# Patient Record
Sex: Female | Born: 1960 | Race: Black or African American | Hispanic: No | Marital: Single | Smoking: Never smoker
Health system: Southern US, Community
[De-identification: ages and names within clinical notes are randomized; demographics above are authoritative.]

---

## 2000-02-08 ENCOUNTER — Other Ambulatory Visit: Admission: RE | Admit: 2000-02-08 | Discharge: 2000-02-08 | Payer: Self-pay | Admitting: Obstetrics and Gynecology

## 2003-02-24 ENCOUNTER — Other Ambulatory Visit: Admission: RE | Admit: 2003-02-24 | Discharge: 2003-02-24 | Payer: Self-pay | Admitting: Obstetrics and Gynecology

## 2003-03-05 ENCOUNTER — Ambulatory Visit (HOSPITAL_COMMUNITY): Admission: RE | Admit: 2003-03-05 | Discharge: 2003-03-05 | Payer: Self-pay | Admitting: Obstetrics and Gynecology

## 2003-03-05 ENCOUNTER — Encounter: Payer: Self-pay | Admitting: Obstetrics and Gynecology

## 2005-01-26 ENCOUNTER — Other Ambulatory Visit: Admission: RE | Admit: 2005-01-26 | Discharge: 2005-01-26 | Payer: Self-pay | Admitting: Obstetrics and Gynecology

## 2005-02-02 ENCOUNTER — Ambulatory Visit (HOSPITAL_COMMUNITY): Admission: RE | Admit: 2005-02-02 | Discharge: 2005-02-02 | Payer: Self-pay | Admitting: Obstetrics and Gynecology

## 2005-10-05 ENCOUNTER — Encounter: Admission: RE | Admit: 2005-10-05 | Discharge: 2005-10-05 | Payer: Self-pay | Admitting: Obstetrics and Gynecology

## 2006-12-10 HISTORY — PX: UTERINE FIBROID SURGERY: SHX826

## 2007-01-03 ENCOUNTER — Encounter: Admission: RE | Admit: 2007-01-03 | Discharge: 2007-01-03 | Payer: Self-pay | Admitting: Obstetrics and Gynecology

## 2007-03-28 ENCOUNTER — Inpatient Hospital Stay (HOSPITAL_COMMUNITY): Admission: RE | Admit: 2007-03-28 | Discharge: 2007-03-30 | Payer: Self-pay | Admitting: Obstetrics and Gynecology

## 2007-03-28 ENCOUNTER — Encounter (INDEPENDENT_AMBULATORY_CARE_PROVIDER_SITE_OTHER): Payer: Self-pay | Admitting: Specialist

## 2008-06-24 ENCOUNTER — Ambulatory Visit: Payer: Self-pay | Admitting: Cardiovascular Disease

## 2008-06-24 ENCOUNTER — Observation Stay (HOSPITAL_COMMUNITY): Admission: EM | Admit: 2008-06-24 | Discharge: 2008-06-25 | Payer: Self-pay | Admitting: Family Medicine

## 2008-06-25 ENCOUNTER — Encounter (INDEPENDENT_AMBULATORY_CARE_PROVIDER_SITE_OTHER): Payer: Self-pay | Admitting: Internal Medicine

## 2009-09-01 ENCOUNTER — Emergency Department (HOSPITAL_COMMUNITY): Admission: EM | Admit: 2009-09-01 | Discharge: 2009-09-01 | Payer: Self-pay | Admitting: Family Medicine

## 2011-04-24 NOTE — H&P (Signed)
[name], [name]                ACCOUNT NO.:  [id number]   MEDICAL RECORD NO.:  [id number]          PATIENT TYPE:  INP   LOCATION:  1828                         FACILITY:  [facility]   PHYSICIAN:  Dominique Gardener, MD    DATE OF BIRTH:  1961/08/21   DATE OF ADMISSION:  06/24/2008  DATE OF DISCHARGE:                              HISTORY & PHYSICAL   PRIMARY PHYSICIAN:  Unassigned.   HISTORY OF PRESENT ILLNESS:  This is a 50 year old female with no  significant past medical history who presented with chest pain.  The  patient woke up from sleep at 1 in the morning with chest pain in the  middle of her chest described as pressure like around 5/10 with no  radiation.  There is no nausea, no vomiting, no shortness of breath.  She took 2 aspirin that improved her pain a little, but her pain is  still present.  She got back to back to sleep, and when she woke up  around 7 in the morning, she was still having some pain.  Again, she  took 2 aspirin, but she was still having some pain, so she decided to  come to the ER.  In the ER, her first troponin was negative.  EKG was  done and came to be normal.   PAST MEDICAL HISTORY:  None.   PAST SURGICAL HISTORY:  None.   MEDICATIONS:  None.   ALLERGIES:  No known drug allergies.   SOCIAL HISTORY:  She denies smoking, denies alcohol drink, denies  recreational drugs.   FAMILY HISTORY:  Her father had heart attack either in his 30s or 45s.  Her uncle died of massive heart attack.  He has history of CABG.   REVIEW OF SYSTEMS:  CONSTITUTIONAL:  There is no fatigability.  There is  no fever.  EYES:  No blurred vision.  ENT:  No tinnitus.  RESPIRATORY:  No cough, no wheezes.  CARDIOVASCULAR:  Positive for chest pain.  There  is no shortness of breath.  There is no syncope.  GASTROINTESTINAL:  No  nausea or vomiting or diarrhea.  GENITOURINARY:  No dysuria or  hematuria.  ENDOCRINE:  No polyuria or nocturia.  HEMATOLOGIC:  No  bruises, no bleeding.   ID:  No rash, no lesions.  NEUROLOGICAL:  No  numbness or tingling.  The rest of systems reviewed, and they were  negative.   PHYSICAL EXAMINATION:  VITAL SIGNS:  Temperature is 99.4, blood pressure  141/98, pulse 105, respiratory rate 16.  GENERAL APPEARANCE:  This is a middle-aged [place] American female in no  acute distress.  HEENT: Conjunctivae are pink.  Pupils are equal and reactive to light.  There is no ptosis.  Hearing is intact.  There is no ear discharge or  infection.  There is no nose infection or bleeding.  Oral mucosa dry.  No pharyngeal erythema.  NECK:  Supple.  No JVD, no carotid bruit, no lymphadenopathy, no thyroid  enlargement, no thyroid tenderness.  CARDIOVASCULAR EXAMINATION:  S1 and S2 regular.  There are no murmurs,  no rubs, and  no thrills.  RESPIRATORY EXAMINATION:  The patient is breathing between 16 to 18.  There are no rales, no rhonchi and no wheezes.  ABDOMEN:  Soft, not distended, no tenderness, no hepatosplenomegaly,  bowel sounds are normal.  LOWER EXTREMITIES:  No edema, no rash and no varicose veins.  SKIN:  No rash and erythema.  NEUROLOGICAL:  Cranial nerves are intact x4.  There are no motor or  sensory deficits.   LABORATORY RESULTS:  Sodium 137, potassium 4.4, chloride 105, bicarb 27,  creatinine 1, BUN 9, glucose 106.  WBC 8.7, hemoglobin 12.8, hematocrit  37.7, MCV 91.9, platelet count 244.  Urinalysis is negative.  Chest x-  ray showed elevation of the left hemidiaphragm.   IMPRESSION:  1. Chest pain.  2. Obesity.  3. Positive family history of coronary artery disease.   PLAN:  This patient's risk factors include obesity and positive family  history.  I will admit her to telemetry.  I will get 3 sets of troponin  and cardiac enzymes.  I will get serial EKG.  I will get echocardiogram  to assess her ejection fraction and to see if there is any wall motion  abnormalities.  If any of the above-mentioned tests including troponin,   EKG or echocardiogram show abnormalities, then will consider cardiology  evaluation for risk stratification of her factors and possible stress  test.   Assessment time is 1 hour.      Dominique Gardener, MD  Electronically Signed     NAE/MEDQ  D:  06/24/2008  T:  06/24/2008  Job:  [id number]

## 2011-04-27 NOTE — Discharge Summary (Signed)
NAMEERYANNA, [name]                ACCOUNT NO.:  [id number]   MEDICAL RECORD NO.:  [id number]          PATIENT TYPE:  OBV   LOCATION:  3734                         FACILITY:  [facility]   PHYSICIAN:  Michelene Gardener, MD    DATE OF BIRTH:  Nov 26, 1961   DATE OF ADMISSION:  06/24/2008  DATE OF DISCHARGE:  06/25/2008                               DISCHARGE SUMMARY   DISCHARGE DIAGNOSES:  1. Atypical chest pain.  2. Obesity.   DISCHARGE MEDICATIONS:  None.   CONSULTATIONS:  None.   PROCEDURES:  None.   RADIOLOGY STUDIES:  1. Chest x-ray on June 24, 2008, showed no acute abnormalities.      Another x-ray was done on June 24, 2008, showed elevated left      hemidiaphragm with mild atelectasis.  2. Echocardiogram on June 25, 2008, showed ejection fraction of 60%      and there was no left ventricular abnormalities.   COURSE OF HOSPITALIZATION:  This is a 50 year old female with no  significant past medical history presented to the hospital with chest  pain.  She has a strong family history of coronary artery disease.  The  patient was admitted for further evaluation and to be ruled out.  Three  sets of troponin and cardiac enzymes were done and they came to be  normal.  Serial EKGs were done and showed no evidence of acute ischemia.  Echocardiogram was done and showed normal ejection fraction with normal  wall motion.  While in the hospital, she was put on aspirin, beta-  blocker, nitroglycerin, and Lovenox.  At the time of discharge, the  patient was totally back to normal.  There is no chest pain.  There is  no shortness of breath.  Her chest pain was felt to be secondary to  stress versus GI symptoms.  She was advised to follow with her primary  doctor if her symptoms recur.  Total assessment time is 1 hour 40  minutes.      Michelene Gardener, MD  Electronically Signed     NAE/MEDQ  D:  07/12/2008  T:  07/13/2008  Job:  [id number]

## 2011-04-27 NOTE — H&P (Signed)
[name]                ACCOUNT NO.:  [id number]   MEDICAL RECORD NO.:  [id number]        PATIENT TYPE:  LINP   LOCATION:                               FACILITY:  [facility]   PHYSICIAN:  Crist Fat. Rivard, M.D. DATE OF BIRTH:  05-25-1961   DATE OF ADMISSION:  03/28/2007  DATE OF DISCHARGE:                              HISTORY & PHYSICAL   HISTORY OF PRESENT ILLNESS:  [name] is a 50 year old single African-  American female, gravida 0, who presents for a robot-assisted myomectomy  because of large uterine fibroids and dysmenorrhea.  This patient has a  longstanding history of uterine fibroids which over the past years has  caused her increasing dysmenorrhea.  She characterizes her dysmenorrhea  as cramps starting one week prior to her menstrual period and lasting  until her period ends.  Her pain is rated at a 7/10 on a 10-point pain  scale; however, she does find relief with ibuprofen 600 mg.  The  patient's menstrual flow will last for 7 days and require the use of 4  tampons daily.  She admits to occasional pelvic pressure but denies any  back pain, urinary tract symptoms, nausea, vomiting, fever or vaginitis  symptoms.  A pelvic ultrasound in January 2008 revealed a uterus  measuring 9.5 x 6.0 x 7.0 cm and 6 measurable uterine fibroids.  The  locations of these fibroids are as follow:  She has two anterior and  lower uterine segment pedunculated fibroids, the first of which measures  4.2 x 3.3 x 3.9 cm, the other 2.9 x 2.3 x 2.2 cm.  Another fibroid is  anteriorly, measuring 2.9 x 2.5 x 2.6 cm.  Posteriorly there is a 2.9 x  2.5 x 2.7 cm fibroid.  Anteriorly on the left there is a 4.4 x 4.3 x 3.8  cm fibroid, and the largest, which also is pedunculated, is anterior on  the left measuring 10.1 x 7.2 x 9.3 cm.  Due to the size of these  fibroids, the patient's ovaries were not able to be identified.  Given  the increasing size of her fibroids and the discomfort she  experiences  before and during her menstrual period, the patient desires to proceed  with myomectomy in order to preserve her fertility.   PAST MEDICAL HISTORY:  OB history:  Is a gravida 0.   GYN history:  Menarche 50 years old.  Last menstrual period March 13, 2007.  The patient uses abstinence as her method of contraception.  She  denies any history of sexually transmitted diseases or abnormal Pap  smears.  Her last normal Pap smear was January 2008, last normal  mammogram January 2008.   Medical history:  Negative.   Surgical history:  Negative.   FAMILY HISTORY:  Prostate cancer, schizophrenia, diabetes mellitus,  hypertension, and cardiovascular disease.   SOCIAL HISTORY:  The patient is single and she works with [employer].   HABITS:  She does not use alcohol or tobacco.   CURRENT MEDICATIONS:  Ibuprofen 600 mg as needed.   She has no known  drug allergies.   REVIEW OF SYSTEMS:  She denied any chest pain, shortness of breath,  headache, vision changes, and except as mentioned in history of present  illness, the patient's review of systems is negative.   PHYSICAL EXAMINATION:  VITAL SIGNS:  Blood pressure is 110/80, weight is  211 pounds, height 5 feet 3-1/2 inches tall.  NECK:  Supple.  There are no masses, thyromegaly or cervical adenopathy.  HEART:  Regular rate and rhythm.  LUNGS:  Clear.  BACK:  No CVA tenderness.  ABDOMEN:  There is no tenderness or organomegaly.  However, the patient  does have a firm mass arising from the pelvis to the level of her  umbilicus.  This mass is more prominent left of midline.  EXTREMITIES:  No clubbing, cyanosis, or edema.  PELVIC:  EG/BUS is within normal limits.  Vagina is rugous.  Cervix is  nontender without lesions.  Uterus is enlarged to approximately 18-20  weeks' size, irregular; however, there is no tenderness.  Adnexa are  without tenderness, and there are no separable masses.  Rectovaginal  exam:   No masses.   IMPRESSION:  1. Large uterine fibroids.  2. Dysmenorrhea.   DISPOSITION:  A discussion was held with the patient regarding the  indication for her procedure along with its risks, which include but are  not limited to reaction to anesthesia, damage to adjacent organs  (bladder, ureters, etc.), infection, excessive bleeding, the possibility  that her surgery may have to be completed through an open incision in  her abdomen, and hysterectomy.  The patient was further made aware of  postoperative facial swelling, the length of her procedure, the  expectation to be discharged on postop day #1 with follow-up in 2 weeks.  The patient was given a Miralax bowel prep to be completed the day prior  to surgery.  The patient verbalized understanding of these risks as well  as expectations and has consented to proceed with a robot-assisted  myomectomy at [facility] on March 28, 2007, at 7:30  a.m.      [name].      Crist Fat Rivard, M.D.  Electronically Signed    EJP/MEDQ  D:  03/22/2007  T:  03/22/2007  Job:  [id number]

## 2011-04-27 NOTE — Op Note (Signed)
NAMEHIBO, BLASDELL                ACCOUNT NO.:  [id number]   MEDICAL RECORD NO.:  [id number]          PATIENT TYPE:  AMB   LOCATION:  DAY                          FACILITY:  [facility]   PHYSICIAN:  Crist Fat. Rivard, M.D. DATE OF BIRTH:  Oct 06, 1961   DATE OF PROCEDURE:  03/28/2007  DATE OF DISCHARGE:                               OPERATIVE REPORT   PREOPERATIVE DIAGNOSIS:  Large uterine fibroids with dysmenorrhea.   POSTOPERATIVE DIAGNOSIS:  Large uterine fibroids with dysmenorrhea.   ANESTHESIA:  General, Dr. Rica Mast.   PROCEDURE:  Diagnostic laparoscopy and myomectomy via laparotomy.   SURGEON:  Crist Fat. Rivard, M.D.   ASSISTANTMarquis Lunch. Powell, P.A.-C.   ESTIMATED BLOOD LOSS:  300 mL.   DESCRIPTION OF PROCEDURE:  After being informed of the planned procedure  with possible complications including bleeding, infection, injury to  bowel, bladder or ureters, need for laparotomy and risks for  hysterectomy, informed consent is obtained.  The patient is taken to OR  10 and given general anesthesia with endotracheal intubation without  complication.  She is placed in the lithotomy position on the sticky  mattress, arms are padded and tucked on her side, legs have knee high  sequential compression devices, shoulder restraints are applied and are  padded extra, and her chest is secured to the table.  She is then  prepped and draped in a sterile fashion.  A Foley catheter is inserted  in her bladder.  GYN exam reveals an enlarged uterus, 20-22 weeks, with  a dominant fundal fibroid which was previously described on ultrasound  as being pedunculated.  During this pelvic exam.  It is impossible to  clarify if the fibroid is pedunculated or not.  A weighted speculum was  inserted and the anterior lip of the cervix was grasped with a tenaculum  forceps.  The uterus was easily sounded at 16.  A Zumi intrauterine  manipulator is inserted and the tenaculum forceps is changed for a  Jacobs  forceps.   The measurement above the umbilicus is at 12 cm for our camera port and  this area is infiltrated with Marcaine 0.255 mL.  We perform a midline  12 mm incision which was brought down sharply to the fascia.  The fascia  is grasped with Kocher forceps, incised, and a pursestring suture is  applied around the fascia.  The peritoneum is entered bluntly.  This  allows easy entry of the 12 mm Hassan trocar which was held in place  with the previously placed pursestring suture.  We can now insufflate a  pneumoperitoneum with CO2 at a maximum pressure of 15 mmHg.  The camera  is inserted.   Observation:  We visualize a greatly enlarged uterus, irregular, and  mainly we see the top of the uterine fibroid.  With this Zumi  manipulator, it is impossible to assess if that is pedunculated or not.  The decision is made to insert a second trocar to be able to mobilize  the fibroid and determine if this procedure will be accessible via  robotic laparoscopy.  A mid body  right 8 mm robotic trocar is inserted  under direct visualization after infiltrating with Marcaine 0.25 and  this allows [place] some manipulation of the uterus.  We are able to  visualize 360 degrees around the fibroid which was not pedunculated and  encompasses the bulk of the uterus.  We are unable to see the anterior  lower uterine segment of the uterus.  Both tubes are normal.  Both  ovaries are normal.  The posterior cul-de-sac is free of disease.  The  fibroid mass appears to be composed of 4-5 different intramural  fibroids.  The decision is made to not to proceed via laparoscopy.  The  scope was removed, the trocars were removed.  The fascia of the camera  port is closed with the pursestring suture previously placed and the  skin of both incisions is closed with subcuticular suture of 3-0  Monocryl.   The patient's legs are brought down and we perform a midline incision  from symphysis pubis to umbilicus.  This was  brought down sharply to the  fascia.  The fascia is incised in a midline fashion and the linea alba  is identified.  The peritoneum is entered in a midline fashion.  We are  able to deliver the bulk of the uterus through the incision and restart  our myomectomy.  A posterior fundal and posterior incision is made after  infiltrating with vasopressin 100 units in 100 mL.  This allows [place]  removal of multiple large fibroids.  The removal of the fibroids was  achieved bluntly and with electrocauterization.  Once we confirmed no  other fibroid is accessible through this incision, it is then deemed  necessary to proceed with closure of the myometrium.  The myometrium is  closed with multiple layers of figure-of-eight stitches of 0 Vicryl.  The serosa over this incision is closed with a baseball suture of 2-0  Vicryl.   We proceed with another right posterior lower uterine incision to remove  six other fibroids and this incision is performed after infiltration  with vasopressin same dilution.  Again, the fibroids were removed  sharply and bluntly, but mostly using electrocauterization.  The  myometrium of this incision, which is about 4 cm, is closed with  multiple layers of the figure-of-eight stitches of 0 Vicryl and with a  baseball suture of 2-0 Vicryl.  Hemostasis of both incisions is  adequate.  We then look on the anterior aspect of the uterus where we  note five fibroids in the left lower uterine segment that are  intramyometrial and we infiltrate that serosa with vasopressin, same  dilution, perform a 4 cm incision, and remove five fibroids from that  area, as well.  Again, the myometrium was closed with figure-of-eight  stitches of 0 Vicryl and with the 2-0 baseball suture.  Hemostasis is  adequate.   Finally, we have to direct our attention towards five anterior broad  ligament fibroids that are right at the lower uterine junction with the bladder.  The serosa is infiltrated with  vasopressin and the anterior  broad ligament is opened.  These fibroids were easily enucleated using  blunt dissection and electrocauterization.  One of the fibroids is  directly adjacent to the uterine artery and uterine vessels.  A figure-  of-eight stitch of 0 Vicryl is necessary to achieve hemostasis.  Hemostasis being adequate, the broad ligament is then closed with a  running suture of 3-0 Vicryl.   25 fibroids have been removed in total.  The uterus has almost returned  to a normal shape.  Both tubes and ovaries are left unharmed.  The  uterine cavity was not entered but it was a matter of a few mm before it  was entered.  We then irrigate profusely with warm saline and note a  satisfactory hemostasis. The uterus was returned in the abdominal pelvic  cavity and all incisions were covered with Intercede.  The fascia  hemostasis is completed with cautery and the fascia is closed with two  running sutures of #1 Vicryl.  The incision is then irrigated with warm  saline.  Hemostasis was completed with cautery and with a right 1 cm  counter incision, we leave a #10 JP drain in the incision.  This drain  is sutured to the skin with 3-0 nylon.  The skin of the incision is then  closed with subcuticular suture of 3-0 Monocryl and 2 inch Steri-Strips.   Instruments and sponge count is complete x2.  Estimated blood loss was  300 mL. The procedure is very well tolerated by the patient who is taken  to the recovery room in a well and stable condition.  Again, 25 fibroid  tumors were removed weighing a total of 650 grams.  If this patient did  pursue pregnancy, I would recommend an elective cesarean section.     Crist Fat Rivard, M.D.  Electronically Signed    SAR/MEDQ  D:  03/28/2007  T:  03/28/2007  Job:  [id number]

## 2011-09-07 LAB — CARDIAC PANEL(CRET KIN+CKTOT+MB+TROPI)
CK, MB: 0.3
CK, MB: 0.3
CK, MB: 0.4
Relative Index: INVALID
Relative Index: INVALID
Relative Index: INVALID
Total CK: 56
Total CK: 59
Total CK: 60
Troponin I: 0.02
Troponin I: <0.01
Troponin I: <0.01

## 2011-09-07 LAB — LIPID PANEL
Cholesterol: 201 — ABNORMAL HIGH
HDL: 45
LDL Cholesterol: 129 — ABNORMAL HIGH
Total CHOL/HDL Ratio: 4.5
Triglycerides: 136
VLDL: 27

## 2011-09-07 LAB — DIFFERENTIAL
Basophils Absolute: 0
Basophils Relative: 0
Eosinophils Absolute: 0
Eosinophils Relative: 0
Lymphocytes Relative: 16
Lymphs Abs: 1.4
Monocytes Absolute: 0.7
Monocytes Relative: 8
Neutro Abs: 6.5
Neutrophils Relative %: 75

## 2011-09-07 LAB — POCT I-STAT, CHEM 8
BUN: 9
Calcium, Ion: 1.23
Chloride: 105
Creatinine, Ser: 1
Glucose, Bld: 106 — ABNORMAL HIGH
HCT: 39
Hemoglobin: 13.3
Potassium: 4.4
Sodium: 137
TCO2: 27

## 2011-09-07 LAB — COMPREHENSIVE METABOLIC PANEL
ALT: 13
AST: 18
Albumin: 3.3 — ABNORMAL LOW
Alkaline Phosphatase: 42
BUN: 7
CO2: 24
Calcium: 8.9
Chloride: 106
Creatinine, Ser: 0.79
GFR calc Af Amer: >60
GFR calc non Af Amer: >60
Glucose, Bld: 101 — ABNORMAL HIGH
Potassium: 3.8
Sodium: 137
Total Bilirubin: 1.1
Total Protein: 6.4

## 2011-09-07 LAB — URINALYSIS, ROUTINE W REFLEX MICROSCOPIC
Bilirubin Urine: NEGATIVE
Glucose, UA: NEGATIVE
Hgb urine dipstick: NEGATIVE
Ketones, ur: NEGATIVE
Nitrite: NEGATIVE
Protein, ur: NEGATIVE
Specific Gravity, Urine: 1.014
Urobilinogen, UA: 0.2
pH: 7

## 2011-09-07 LAB — MAGNESIUM: Magnesium: 2

## 2011-09-07 LAB — PREGNANCY, URINE: Preg Test, Ur: NEGATIVE

## 2011-09-07 LAB — CBC
HCT: 37.7
Hemoglobin: 12.8
MCHC: 33.9
MCV: 91.9
Platelets: 244
RBC: 4.1
RDW: 12.6
WBC: 8.7

## 2011-09-07 LAB — POCT CARDIAC MARKERS
CKMB, poc: <1 — ABNORMAL LOW
Myoglobin, poc: 31.2
Operator id: 294501
Troponin i, poc: <0.05

## 2011-09-07 LAB — D-DIMER, QUANTITATIVE: D-Dimer, Quant: 0.33

## 2011-09-07 LAB — PHOSPHORUS: Phosphorus: 4.2

## 2011-09-07 LAB — TSH: TSH: 1.91

## 2012-08-04 ENCOUNTER — Ambulatory Visit: Payer: Self-pay | Admitting: Family Medicine

## 2012-08-04 ENCOUNTER — Ambulatory Visit: Payer: Self-pay

## 2012-08-04 VITALS — BP 137/85 | HR 84 | Temp 98.5°F | Resp 20 | Ht 64.0 in | Wt 234.0 lb

## 2012-08-04 DIAGNOSIS — M25531 Pain in right wrist: Secondary | ICD-10-CM

## 2012-08-04 DIAGNOSIS — M778 Other enthesopathies, not elsewhere classified: Secondary | ICD-10-CM

## 2012-08-04 DIAGNOSIS — M25539 Pain in unspecified wrist: Secondary | ICD-10-CM

## 2012-08-04 MED ORDER — PREDNISONE 20 MG PO TABS: ORAL_TABLET | ORAL | Status: AC

## 2012-08-04 NOTE — Progress Notes (Signed)
This 51 year old woman comes in with right wrist pain 48 hours. 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 She says the pain is more prominent on the ulnar side of the distal forearm and that it hurts to flex and extend her wrist. She recalls no specific injury that would've led to this work. She denies any falls on outstretched hand. She has no chronic ongoing arthritis issues.  Objective: Right wrist appears normal She has no Tinel's or Finkelstein's test positive Range of motion is restricted because of pain-patient does not flex or extend the wrist but does have minimal lateral movement either way. Skin: No signs of trauma [facility] reading (PRIMARY) by  Dr. Milus Glazier.  Right wrist film: negative for bony abn   Assessment:  Right wrist tendinitis  Plan: Wrist splint for week, prednisone 20 mg 2 daily for 5 days with food, and work note for today and tomorrow

## 2013-10-02 ENCOUNTER — Ambulatory Visit (INDEPENDENT_AMBULATORY_CARE_PROVIDER_SITE_OTHER): Payer: BC Managed Care – PPO | Admitting: Internal Medicine

## 2013-10-02 VITALS — BP 128/72 | HR 95 | Temp 98.6°F | Resp 16 | Ht 64.0 in | Wt 244.4 lb

## 2013-10-02 DIAGNOSIS — Z23 Encounter for immunization: Secondary | ICD-10-CM

## 2013-10-02 DIAGNOSIS — R05 Cough: Secondary | ICD-10-CM

## 2013-10-02 DIAGNOSIS — R059 Cough, unspecified: Secondary | ICD-10-CM

## 2013-10-02 DIAGNOSIS — J069 Acute upper respiratory infection, unspecified: Secondary | ICD-10-CM

## 2013-10-02 MED ORDER — BENZONATATE 100 MG PO CAPS: 100.0000 mg | ORAL_CAPSULE | Freq: Three times a day (TID) | ORAL | Status: DC | PRN

## 2013-10-02 NOTE — Patient Instructions (Signed)
Take Mucinex to help loosen congestion. Drink plenty of fluids. Return if fever greater than 101, shortness of breath, no improvement in 5-7 days. Make appointment for complete physical exam at appointment center.

## 2013-10-02 NOTE — Progress Notes (Signed)
  Subjective:    Patient ID: [name], female    DOB: 1961/09/16, 52 y.o.   MRN: [id number]  HPI Soret throat, cough and fever for 8 days. Took advil cold and sinus with considerable relief. Didn't take temp, felt warm, had chills. Is feeling much better this afternoon. No headache. No ear pain. Nasal congestion, clear, thin. Cough intermittently productive, no color. Cough wakes her up at night. No SOB, some pain with coughing, better now.  Slightly decreased appetite, no nausea or vomiting.  Works at [employer] and is exposed to paper dust. She has been able to work this week.   Review of Systems  Constitutional: Positive for fever and chills.  HENT: Positive for congestion and rhinorrhea. Negative for ear pain, postnasal drip and sinus pressure.   Respiratory: Positive for cough. Negative for shortness of breath.   Cardiovascular: Positive for chest pain.       CP with cough only, better now.       Objective:   Physical Exam  Nursing note and vitals reviewed. Constitutional: She is oriented to person, place, and time. She appears well-developed and well-nourished. No distress.  HENT:  Head: Normocephalic.  Right Ear: Tympanic membrane, external ear and ear canal normal.  Left Ear: Tympanic membrane, external ear and ear canal normal.  Nose: Rhinorrhea present.  Mouth/Throat: Uvula is midline and mucous membranes are normal. Oropharyngeal exudate and posterior oropharyngeal erythema present.  Eyes: Pupils are equal, round, and reactive to light. Right eye exhibits no discharge. Left eye exhibits no discharge.  Neck: Normal range of motion. Neck supple.  Cardiovascular: Normal rate, regular rhythm and normal heart sounds.   Pulmonary/Chest: Effort normal and breath sounds normal.  Lymphadenopathy:    She has no cervical adenopathy.  Neurological: She is alert and oriented to person, place, and time.  Skin: Skin is warm and dry. She is not diaphoretic.   Psychiatric: She has a normal mood and affect. Her behavior is normal. Judgment and thought content normal.       Assessment & Plan:  Cough  Acute upper respiratory infections of unspecified site  Need for prophylactic vaccination and inoculation against influenza - Plan: Flu Vaccine QUAD 36+ mos IM  1- cough/viral URI- discussed s/s of flu and bacterial vs. Viral URI. Will treat symptomatically with tessalon pearles, mucinex otc, increase fluids. RTC if no improvement in 5-7 days, worsening symptoms, fever greater than 101. 2- flu shot today 3- Patient desires to make appointment for CPE at appointment center and is aware of need for pap/mammo.  I have completed the patient encounter in its entirety as documented by FNP Leone Payor, with editing by me where necessary. Madalina Rosman P. Merla Riches, M.D.

## 2013-12-01 ENCOUNTER — Ambulatory Visit: Payer: BC Managed Care – PPO | Admitting: Family Medicine

## 2014-03-30 ENCOUNTER — Ambulatory Visit (INDEPENDENT_AMBULATORY_CARE_PROVIDER_SITE_OTHER): Payer: BC Managed Care – PPO

## 2014-03-30 ENCOUNTER — Encounter: Payer: Self-pay | Admitting: Podiatry

## 2014-03-30 ENCOUNTER — Ambulatory Visit (INDEPENDENT_AMBULATORY_CARE_PROVIDER_SITE_OTHER): Payer: BC Managed Care – PPO | Admitting: Podiatry

## 2014-03-30 VITALS — BP 128/88 | HR 61 | Resp 16 | Ht 64.0 in | Wt 240.0 lb

## 2014-03-30 DIAGNOSIS — M722 Plantar fascial fibromatosis: Secondary | ICD-10-CM

## 2014-03-30 MED ORDER — MELOXICAM 15 MG PO TABS: 15.0000 mg | ORAL_TABLET | Freq: Every day | ORAL | Status: DC

## 2014-03-30 MED ORDER — METHYLPREDNISOLONE (PAK) 4 MG PO TABS: ORAL_TABLET | ORAL | Status: DC

## 2014-03-30 NOTE — Progress Notes (Signed)
   Subjective:    Patient ID: [name], female    DOB: 1961/04/14, 53 y.o.   MRN: [id number]  HPI Comments: N heel pain L right heel lateral and medial inferior arch D 7 years O on and off for years, worsening 2 weeks C sharp pain A worse in the morning and after rest T [facility] orthotics  Foot Pain      Review of Systems  Musculoskeletal: Positive for back pain.  Skin: Positive for color change.       Nails - B/L 1st toenails.  All other systems reviewed and are negative.      Objective:   Physical Exam I have reviewed her past medical history medications allergies surgeries social history and review of systems. Vital signs are stable she is alert and oriented x3. Pulses are strong and stable. Capillary fill time to digits one through 5 bilateral foot is immediate. Neurologic sensorium is intact per since once the monofilament and deep tendon reflexes are brisk and equal bilateral. Muscle strength is 5 over 5 dorsiflexors plantar flexors inverters everters all intrinsic musculature is intact. Orthopedic evaluation demonstrates all joints distal to the ankle a full range of motion without crepitation. She has no pain on medial and lateral compression of the calcaneus however she does have pain on direct palpation of the medial calcaneal tubercle of the right heel. Left heel to lesser degree. Radiographic evaluation demonstrates soft tissue increase in density at the plantar fascial calcaneal insertion sites bilateral.        Assessment & Plan:  Assessment: Plantar fasciitis bilateral.  Plan: We discussed the etiology pathology conservative versus surgical therapies. I injected her bilateral heels today with Kenalog and local anesthetic. A total of 20 mg was injected into each heel. Plantar fascial brace is were dispensed bilaterally. A night splint was dispensed for the right foot. She was scanned today for orthotics. We also sent her home with a prescription for Medrol Dosepak to  be followed by medic in we discussed these medications in detail. I will followup with her in one month however between now and then she was given both oral and written home-going instructions for the care of this foot. She was also discussed shoe gear modification icing stretching exercises.

## 2014-04-29 ENCOUNTER — Ambulatory Visit (INDEPENDENT_AMBULATORY_CARE_PROVIDER_SITE_OTHER): Payer: BC Managed Care – PPO | Admitting: Podiatry

## 2014-04-29 ENCOUNTER — Encounter: Payer: Self-pay | Admitting: Podiatry

## 2014-04-29 VITALS — BP 122/80 | HR 62 | Resp 16

## 2014-04-29 DIAGNOSIS — M722 Plantar fascial fibromatosis: Secondary | ICD-10-CM

## 2014-04-29 NOTE — Progress Notes (Signed)
She presents today for followup of her plantar fasciitis of her right foot states it is approximately 80% better and continues all conservative therapies.  Objective: Pulses are palpable right foot. No calf pain. She has pain on palpation medial calcaneal tubercle of the right heel.  Assessment: Plantar fasciitis right foot.  Plan: Injected the right heel today with Kenalog and local anesthetic and she will followup with me on an as-needed basis. She will continue all conservative therapies.

## 2014-06-03 ENCOUNTER — Ambulatory Visit: Payer: BC Managed Care – PPO | Admitting: Podiatry

## 2014-06-10 ENCOUNTER — Other Ambulatory Visit: Payer: Self-pay | Admitting: Podiatry

## 2014-06-10 NOTE — Telephone Encounter (Signed)
Pt needs an appt prior to future refills. 

## 2015-01-03 ENCOUNTER — Encounter (HOSPITAL_COMMUNITY): Payer: Self-pay | Admitting: Emergency Medicine

## 2015-01-03 ENCOUNTER — Emergency Department (HOSPITAL_COMMUNITY)
Admission: EM | Admit: 2015-01-03 | Discharge: 2015-01-03 | Disposition: A | Discharge status: Home / Self Care | Payer: BLUE CROSS/BLUE SHIELD | Source: Home / Self Care | Attending: Emergency Medicine | Admitting: Emergency Medicine

## 2015-01-03 DIAGNOSIS — J209 Acute bronchitis, unspecified: Secondary | ICD-10-CM

## 2015-01-03 MED ORDER — PREDNISONE 50 MG PO TABS: ORAL_TABLET | ORAL | Status: DC

## 2015-01-03 MED ORDER — AZITHROMYCIN 250 MG PO TABS: ORAL_TABLET | ORAL | Status: DC

## 2015-01-03 MED ORDER — HYDROCODONE-HOMATROPINE 5-1.5 MG/5ML PO SYRP: 5.0000 mL | ORAL_SOLUTION | Freq: Four times a day (QID) | ORAL | Status: DC | PRN

## 2015-01-03 NOTE — ED Notes (Signed)
Reports "cold and flu" symptoms that started Wednesday.

## 2015-01-03 NOTE — ED Notes (Signed)
Stuffy nose, sore throat and cough, cough causes pain in torso

## 2015-01-03 NOTE — ED Provider Notes (Signed)
CSN: [id number]     Arrival date & time 01/03/15  1036 History   First MD Initiated Contact with Patient 01/03/15 1248     Chief Complaint  Patient presents with  . URI   (Consider location/radiation/quality/duration/timing/severity/associated sxs/prior Treatment) HPI  Patient is a 54 year old woman here for evaluation of cough. She states for 5 days she has had nasal congestion, sore throat, cough. Over the last few days she has had pain in her chest and rib cage when she coughs. Her symptoms have gradually been worsening. She reports subjective fevers. No nausea, vomiting, diarrhea. No shortness of breath.  History reviewed. No pertinent past medical history. Past Surgical History  Procedure Laterality Date  . Uterine fibroid surgery  2008   Family History  Problem Relation Age of Onset  . Arthritis Mother   . Hypertension Mother   . Hypertension Father   . Heart disease Father   . Hypertension Sister   . Thyroid disease Sister    History  Substance Use Topics  . Smoking status: Never Smoker   . Smokeless tobacco: Not on file  . Alcohol Use: No   OB History    No data available     Review of Systems  Constitutional: Positive for fever. Negative for appetite change.  HENT: Positive for congestion and sore throat. Negative for rhinorrhea, sinus pressure and trouble swallowing.   Respiratory: Positive for cough. Negative for shortness of breath.   Gastrointestinal: Negative for nausea, vomiting, abdominal pain and diarrhea.  Musculoskeletal: Negative for myalgias.  Neurological: Negative for headaches.    Allergies  Review of patient's allergies indicates no known allergies.  Home Medications   Prior to Admission medications   Medication Sig Start Date End Date Taking? Authorizing Provider  azithromycin (ZITHROMAX Z-PAK) 250 MG tablet Take 2 pills today, then 1 pill daily until gone. 01/03/15   Charm Rings, MD  BIOTIN PO Take by mouth.    Historical Provider, MD   HYDROcodone-homatropine (HYCODAN) 5-1.5 MG/5ML syrup Take 5 mLs by mouth every 6 (six) hours as needed for cough. 01/03/15   Charm Rings, MD  ibuprofen (ADVIL,MOTRIN) 200 MG tablet Take 400 mg by mouth every 6 (six) hours as needed.    Historical Provider, MD  meloxicam (MOBIC) 15 MG tablet TAKE 1 TABLET BY MOUTH EVERY DAY 06/10/14   [name], DPM  methylPREDNIsolone (MEDROL DOSPACK) 4 MG tablet follow package directions 03/30/14   [name], DPM  predniSONE (DELTASONE) 50 MG tablet Take 1 pill daily for 5 days 01/03/15   Charm Rings, MD  Prenatal Vit-Fe Fumarate-FA (PRENATAL MULTIVITAMIN) TABS tablet Take 1 tablet by mouth daily at 12 noon.    Historical Provider, MD   BP 164/98 mmHg  Pulse 96  Temp(Src) 98.9 F (37.2 C) (Oral)  Resp 16  SpO2 96%  LMP 12/14/2014 Physical Exam  Constitutional: She is oriented to person, place, and time. She appears well-developed and well-nourished. No distress.  HENT:  Nose: Nose normal.  Mouth/Throat: Oropharynx is clear and moist. No oropharyngeal exudate.  Neck: Neck supple.  Cardiovascular: Normal rate, regular rhythm and normal heart sounds.   No murmur heard. Pulmonary/Chest: Breath sounds normal. No respiratory distress. She has no wheezes. She has no rales.  Diffuse rhonchi, clears with cough  Lymphadenopathy:    She has no cervical adenopathy.  Neurological: She is alert and oriented to person, place, and time.    ED Course  Procedures (including critical care time) Labs  Review Labs Reviewed - No data to display  Imaging Review No results found.   MDM   1. Acute bronchitis, unspecified organism    We'll treat with a Z-Pak and 5 days of prednisone. Hycodan to use as needed for cough. Follow-up as needed.    Charm RingsErin J Honig, MD 01/03/15 [phone]

## 2015-01-03 NOTE — Discharge Instructions (Signed)
You have bronchitis. Take prednisone 1 pill daily for 5 days. Take azithromycin as prescribed. Use Hycodan every 4 hours as needed for coughing. This medicine has a narcotic in it so do not take while driving. You should start to feel better in the next 2-3 days. Follow-up as needed.   Acute Bronchitis Bronchitis is when the airways that extend from the windpipe into the lungs get red, puffy, and painful (inflamed). Bronchitis often causes thick spit (mucus) to develop. This leads to a cough. A cough is the most common symptom of bronchitis. In acute bronchitis, the condition usually begins suddenly and goes away over time (usually in 2 weeks). Smoking, allergies, and asthma can make bronchitis worse. Repeated episodes of bronchitis may cause more lung problems. HOME CARE  Rest.  Drink enough fluids to keep your pee (urine) clear or pale yellow (unless you need to limit fluids as told by your doctor).  Only take over-the-counter or prescription medicines as told by your doctor.  Avoid smoking and secondhand smoke. These can make bronchitis worse. If you are a smoker, think about using nicotine gum or skin patches. Quitting smoking will help your lungs heal faster.  Reduce the chance of getting bronchitis again by:  Washing your hands often.  Avoiding people with cold symptoms.  Trying not to touch your hands to your mouth, nose, or eyes.  Follow up with your doctor as told. GET HELP IF: Your symptoms do not improve after 1 week of treatment. Symptoms include:  Cough.  Fever.  Coughing up thick spit.  Body aches.  Chest congestion.  Chills.  Shortness of breath.  Sore throat. GET HELP RIGHT AWAY IF:   You have an increased fever.  You have chills.  You have severe shortness of breath.  You have bloody thick spit (sputum).  You throw up (vomit) often.  You lose too much body fluid (dehydration).  You have a severe headache.  You faint. MAKE SURE YOU:    Understand these instructions.  Will watch your condition.  Will get help right away if you are not doing well or get worse. Document Released: 05/14/2008 Document Revised: 07/29/2013 Document Reviewed: 05/19/2013 [facility] Patient Information 2015 [place], [place]. This information is not intended to replace advice given to you by your health care provider. Make sure you discuss any questions you have with your health care provider.

## 2015-02-07 ENCOUNTER — Ambulatory Visit (INDEPENDENT_AMBULATORY_CARE_PROVIDER_SITE_OTHER): Payer: BLUE CROSS/BLUE SHIELD | Admitting: Family Medicine

## 2015-02-07 VITALS — BP 110/76 | HR 63 | Temp 98.1°F | Resp 20 | Ht 63.5 in | Wt 239.1 lb

## 2015-02-07 DIAGNOSIS — J206 Acute bronchitis due to rhinovirus: Secondary | ICD-10-CM

## 2015-02-07 MED ORDER — PREDNISONE 50 MG PO TABS
ORAL_TABLET | ORAL | Status: DC
Start: 2015-02-07 — End: 2015-11-02

## 2015-02-07 MED ORDER — HYDROCODONE-HOMATROPINE 5-1.5 MG/5ML PO SYRP: 5.0000 mL | ORAL_SOLUTION | Freq: Four times a day (QID) | ORAL | Status: DC | PRN

## 2015-02-07 MED ORDER — AMOXICILLIN 875 MG PO TABS: 875.0000 mg | ORAL_TABLET | Freq: Two times a day (BID) | ORAL | Status: DC

## 2015-02-07 NOTE — Progress Notes (Signed)
This chart was scribed for Dominique SidleKurt Dreamer Carillo, MD by [name], ED Scribe. This patient was seen in room 2 and the patient's care was started at 6:42 PM.  Patient ID: Dominique PontoJudy L Leach MRN: [id number], DOB: [birth year], 54 y.o. Date of Encounter: 02/07/2015, 6:42 PM  Primary Physician: No PCP Per Patient  Chief Complaint:  Chief Complaint  Patient presents with  . Cough    [facility]     HPI: 54 y.o. year old female who works at a [employer] with PMhx history below presents to the office complaining of a recurring cough that started a 1 week ago. Pt states that she was [facility], she says that she thought that she was getting better but her symptoms are coming back.  Pt was prescribed cough syrup, prednisone, and z-pac. She denies and fever, neck pain, sore throat, visual disturbance, CP, cough, SOB, abdominal pain, nausea, emesis, diarrhea, urinary symptoms, back pain, HA, weakness, numbness and rash as associated symptoms.     History reviewed. No pertinent past medical history.   Home Meds: Prior to Admission medications   Medication Sig Start Date End Date Taking? Authorizing Provider  ibuprofen (ADVIL,MOTRIN) 200 MG tablet Take 400 mg by mouth every 6 (six) hours as needed.   Yes Historical Provider, MD  Prenatal Vit-Fe Fumarate-FA (PRENATAL MULTIVITAMIN) TABS tablet Take 1 tablet by mouth daily at 12 noon.   Yes Historical Provider, MD  BIOTIN PO Take by mouth.    Historical Provider, MD  meloxicam (MOBIC) 15 MG tablet TAKE 1 TABLET BY MOUTH EVERY DAY Patient not taking: Reported on 02/07/2015 06/10/14   [name], DPM    Allergies: No Known Allergies  History   Social History  . Marital Status: Single    Spouse Name: N/A  . Number of Children: N/A  . Years of Education: N/A   Occupational History  . Not on file.   Social History Main Topics  . Smoking status: Never Smoker   .  Smokeless tobacco: Never Used  . Alcohol Use: No  . Drug Use: No  . Sexual Activity: Not on file   Other Topics Concern  . Not on file   Social History Narrative     Review of Systems: positive cough Constitutional: negative for chills, fever, night sweats, weight changes, or fatigue  HEENT: negative for vision changes, hearing loss, congestion, rhinorrhea, ST, epistaxis, or sinus pressure Cardiovascular: negative for chest pain or palpitations Respiratory: negative for hemoptysis, wheezing, shortness of breath, or cough Abdominal: negative for abdominal pain, nausea, vomiting, diarrhea, or constipation Dermatological: negative for rash Neurologic: negative for headache, dizziness, or syncope All other systems reviewed and are otherwise negative with the exception to those above and in the HPI.   Physical Exam: Blood pressure 110/76, pulse 63, temperature 98.1 F (36.7 C), temperature source Oral, resp. rate 20, height 5' 3.5" (1.613 m), weight 239 lb 2 oz (108.466 kg), last menstrual period 02/03/2015, SpO2 96 %., Body mass index is 41.69 kg/(m^2). General: Well developed, well nourished, in no acute distress. Head: Normocephalic, atraumatic, eyes without discharge, sclera non-icteric, nares are without discharge. Bilateral auditory canals clear, TM's are without perforation, pearly grey and translucent with reflective cone of light bilaterally. Oral cavity moist, posterior pharynx without exudate, erythema, peritonsillar abscess, or post nasal drip.  Neck: Supple. No thyromegaly. Full ROM. No lymphadenopathy. Lungs: Clear bilaterally to auscultation without wheezes, rales,  or rhonchi. Breathing is unlabored. Heart: RRR with S1 S2. No murmurs, rubs, or gallops appreciated. Abdomen: Soft, non-tender, non-distended with normoactive bowel sounds. No hepatomegaly. No rebound/guarding. No obvious abdominal masses. Msk:  Strength and tone normal for age. Extremities/Skin: Warm and dry. No  clubbing or cyanosis. No edema. No rashes or suspicious lesions. Neuro: Alert and oriented X 3. Moves all extremities spontaneously. Gait is normal. CNII-XII grossly in tact. Psych:  Responds to questions appropriately with a normal affect.     ASSESSMENT AND PLAN:  54 y.o. year old female with  This chart was scribed in my presence and reviewed by me personally.    ICD-9-CM ICD-10-CM   1. Acute bronchitis due to Rhinovirus 466.0 J20.6 amoxicillin (AMOXIL) 875 MG tablet   079.3  HYDROcodone-homatropine (HYCODAN) 5-1.5 MG/5ML syrup     predniSONE (DELTASONE) 50 MG tablet     Signed, Dominique Sidle, MD   Signed, Dominique Sidle, MD 02/07/2015 6:42 PM

## 2015-02-07 NOTE — Patient Instructions (Signed)

## 2015-11-02 ENCOUNTER — Ambulatory Visit (INDEPENDENT_AMBULATORY_CARE_PROVIDER_SITE_OTHER): Payer: BLUE CROSS/BLUE SHIELD | Admitting: Family Medicine

## 2015-11-02 VITALS — BP 132/80 | HR 97 | Temp 98.2°F | Resp 16 | Ht 63.5 in | Wt 249.6 lb

## 2015-11-02 DIAGNOSIS — J209 Acute bronchitis, unspecified: Secondary | ICD-10-CM | POA: Diagnosis not present

## 2015-11-02 DIAGNOSIS — J029 Acute pharyngitis, unspecified: Secondary | ICD-10-CM

## 2015-11-02 DIAGNOSIS — J069 Acute upper respiratory infection, unspecified: Secondary | ICD-10-CM | POA: Diagnosis not present

## 2015-11-02 DIAGNOSIS — R030 Elevated blood-pressure reading, without diagnosis of hypertension: Secondary | ICD-10-CM | POA: Diagnosis not present

## 2015-11-02 MED ORDER — AMOXICILLIN 875 MG PO TABS: 875.0000 mg | ORAL_TABLET | Freq: Two times a day (BID) | ORAL | Status: DC

## 2015-11-02 NOTE — Patient Instructions (Addendum)
Drink plenty of fluids and get enough rest  Take the amoxicillin one twice daily for infection  Use over-the-counter DM-containing cough syrup if needed for cough  Only use the cold and sinus medication when very congested.  Return if not improving  She is to recheck her blood pressure on outpatient basis.

## 2015-11-02 NOTE — Progress Notes (Addendum)
Patient ID: Dominique PontoJudy L Leach, female    DOB: September 09, 1961  Age: 54 y.o. MRN: [id number]  Chief Complaint  Patient presents with  . Sore Throat    x friday  . Nasal Congestion    x friday  . Cough    x friday  . Flu Vaccine    wants the flu shot if given the ok to get it today    Subjective:   54 year old lady who comes in with the history of fibrocystic 6 days of cough and congestion and sore throat. The throat is been more. She hurts a little in the right anterior chest when she coughs. She has not been feverish. She's been taking Tylenol on a regular basis. She is taken at a cold and sinus product, which potentially could raise blood pressure. She does not smoke. She works a job, but is off today. She has worked less couple of days. She is not a regular medications. She is generally a healthy lady.  Current allergies, medications, problem list, past/family and social histories reviewed.  Objective:  BP 132/80 mmHg  Pulse 97  Temp(Src) 98.2 F (36.8 C) (Oral)  Resp 16  Ht 5' 3.5" (1.613 m)  Wt 249 lb 9.6 oz (113.218 kg)  BMI 43.52 kg/m2  SpO2 98%  LMP 10/17/2015  No major distress. TMs are normal. Throat has moderate erythema with the left tonsil having a white area of infection on it. Her neck has small anterior cervical nodes. Chest is clear to status. Heart regular without murmurs.  Assessment & Plan:   Assessment: 1. Acute bronchitis, unspecified organism   2. Acute upper respiratory infection   3. Acute pharyngitis, unspecified etiology   4. Elevated blood pressure (not hypertension)       Plan: With the way the throat looks will go ahead and her on antibiotics. Continue drinking plenty of fluids and taking OTC cough and cold preparations as needed. However she is to monitor blood pressures in the future make sure that they are okay.  No orders of the defined types were placed in this encounter.    Meds ordered this encounter  Medications  . amoxicillin (AMOXIL)  875 MG tablet    Sig: Take 1 tablet (875 mg total) by mouth 2 (two) times daily.    Dispense:  20 tablet    Refill:  0         Patient Instructions  Drink plenty of fluids and get enough rest  Take the amoxicillin one twice daily for infection  Use over-the-counter DM-containing cough syrup if needed for cough  Only use the cold and sinus medication when very congested.  Return if not improving  She is to recheck her blood pressure on outpatient basis.     Return if symptoms worsen or fail to improve.   Alannie Amodio, MD 11/02/2015

## 2015-11-13 ENCOUNTER — Telehealth: Payer: Self-pay

## 2015-11-13 NOTE — Telephone Encounter (Signed)
Requesting more antibiotics - seen just b/4 Thanksgiving   [phone]

## 2015-11-14 NOTE — Telephone Encounter (Signed)
Patient Instructions  Drink plenty of fluids and get enough rest  Take the amoxicillin one twice daily for infection  Use over-the-counter DM-containing cough syrup if needed for cough  Only use the cold and sinus medication when very congested.  Return if not improving  She is to recheck her blood pressure on outpatient basis.     Return if symptoms worsen or fail to improve.        Left message to RTC.

## 2016-09-13 ENCOUNTER — Ambulatory Visit (HOSPITAL_COMMUNITY)
Admission: EM | Admit: 2016-09-13 | Discharge: 2016-09-13 | Disposition: A | Discharge status: Home / Self Care | Payer: BLUE CROSS/BLUE SHIELD | Attending: Internal Medicine | Admitting: Internal Medicine

## 2016-09-13 ENCOUNTER — Encounter (HOSPITAL_COMMUNITY): Payer: Self-pay | Admitting: Emergency Medicine

## 2016-09-13 DIAGNOSIS — L539 Erythematous condition, unspecified: Secondary | ICD-10-CM

## 2016-09-13 DIAGNOSIS — W57XXXA Bitten or stung by nonvenomous insect and other nonvenomous arthropods, initial encounter: Secondary | ICD-10-CM

## 2016-09-13 MED ORDER — TRIAMCINOLONE ACETONIDE 0.1 % EX CREA: 1.0000 "application " | TOPICAL_CREAM | Freq: Two times a day (BID) | CUTANEOUS | 0 refills | Status: DC

## 2016-09-13 NOTE — Discharge Instructions (Signed)
Apply the triamcinolone cream to affected areas twice a day. May apply the Benadryl cream to affected areas 4 times a day. For any areas with swelling redness and intense itching may also apply cold compresses or eyes packs. For worsening, new symptoms or problems such has generalized swelling, swelling of the tongue or throat, trouble breathing cough sig medical attention promptly.

## 2016-09-13 NOTE — ED Provider Notes (Signed)
CSN: [id number]     Arrival date & time 09/13/16  1113 History   First MD Initiated Contact with Patient 09/13/16 1247     Chief Complaint  Patient presents with  . Insect Bite   (Consider location/radiation/quality/duration/timing/severity/associated sxs/prior Treatment) 55 year old female presents to the urgent care after receiving several ant bites from early yesterday morning. She saw several small black ants crawling on her in the bed. Her complaints are that of local lesions that are primarily red and itchy. She has bites to her extremities, behind the right year and neck and one to the right upper eyelid. She has taken Benadryl which does help with the itching.      History reviewed. No pertinent past medical history. Past Surgical History:  Procedure Laterality Date  . UTERINE FIBROID SURGERY  2008   Family History  Problem Relation Age of Onset  . Arthritis Mother   . Hypertension Mother   . Hypertension Father   . Heart disease Father   . Hypertension Sister   . Thyroid disease Sister    Social History  Substance Use Topics  . Smoking status: Never Smoker  . Smokeless tobacco: Never Used  . Alcohol use No   OB History    No data available     Review of Systems  Constitutional: Negative.   HENT: Negative.   Eyes: Negative for photophobia, pain, discharge, redness and visual disturbance.  Respiratory: Negative.   Gastrointestinal: Negative.   Musculoskeletal: Negative.   Skin: Negative for rash.  Neurological: Negative.   All other systems reviewed and are negative.   Allergies  Review of patient's allergies indicates no known allergies.  Home Medications   Prior to Admission medications   Medication Sig Start Date End Date Taking? Authorizing Provider  BIOTIN PO Take by mouth.   Yes Historical Provider, MD  diphenhydrAMINE (BENADRYL) 25 MG tablet Take 50 mg by mouth every 6 (six) hours as needed.   Yes Historical Provider, MD  Prenatal Vit-Fe  Fumarate-FA (PRENATAL MULTIVITAMIN) TABS tablet Take 1 tablet by mouth daily at 12 noon.   Yes Historical Provider, MD  ibuprofen (ADVIL,MOTRIN) 200 MG tablet Take 400 mg by mouth every 6 (six) hours as needed.    Historical Provider, MD  triamcinolone cream (KENALOG) 0.1 % Apply 1 application topically 2 (two) times daily. 09/13/16   Hayden Rasmussenavid Finnbar Cedillos, NP   Meds Ordered and Administered this Visit  Medications - No data to display  BP 156/96 (BP Location: Left Arm)   Pulse 78   Temp 98.6 F (37 C) (Oral)   LMP 08/24/2016 (Exact Date)   SpO2 99%  No data found.   Physical Exam  Constitutional: She is oriented to person, place, and time. She appears well-developed and well-nourished. No distress.  HENT:  Head: Normocephalic and atraumatic.  Mouth/Throat: Oropharynx is clear and moist.  Eyes: Conjunctivae and EOM are normal. Pupils are equal, round, and reactive to light. No scleral icterus.  Right eyelid, upper and lower with mild puffiness. Sclera clear. No evidence of conjunctivitis. Normal pupillary response. No hyphema. Anterior chambers clear. No evidence of infection.  Neck: Normal range of motion. Neck supple.  Cardiovascular: Normal rate and regular rhythm.   Pulmonary/Chest: Effort normal and breath sounds normal. No respiratory distress. She has no wheezes.  Musculoskeletal: She exhibits no edema.  Neurological: She is alert and oriented to person, place, and time. She exhibits normal muscle tone.  Skin: Skin is warm and dry.  Several small papules with  a few small pinpoint pustules to the extremities. Few areas with surrounding erythema. No peripheral edema. No intraoral edema or swelling.  Psychiatric: She has a normal mood and affect.  Nursing note and vitals reviewed.   Urgent Care Course   Clinical Course    Procedures (including critical care time)  Labs Review Labs Reviewed - No data to display  Imaging Review No results found.   Visual Acuity Review  Right  Eye Distance:   Left Eye Distance:   Bilateral Distance:    Right Eye Near:   Left Eye Near:    Bilateral Near:         MDM   1. Insect bite, initial encounter    Apply the triamcinolone cream to affected areas twice a day. May apply the Benadryl cream to affected areas 4 times a day. For any areas with swelling redness and intense itching may also apply cold compresses or eyes packs. For worsening, new symptoms or problems such has generalized swelling, swelling of the tongue or throat, trouble breathing cough sig medical attention promptly. Meds ordered this encounter  Medications  . diphenhydrAMINE (BENADRYL) 25 MG tablet    Sig: Take 50 mg by mouth every 6 (six) hours as needed.  . triamcinolone cream (KENALOG) 0.1 %    Sig: Apply 1 application topically 2 (two) times daily.    Dispense:  15 g    Refill:  0    Order Specific Question:   Supervising Provider    Answer:   Eustace Moore [[id number]]       Hayden Rasmussen, NP 09/13/16 2101

## 2016-09-13 NOTE — ED Triage Notes (Signed)
Pt woke up late Tuesday night/Wednesday morning and discovered black ants in her bed.  She reports bites on her arms, legs, back and one on her right eye.  There are bites on her forearms with white heads and she has some redness and swelling to her right eye lid.  Pt denies any pain.  She took 50 mg of Benadryl last night.

## 2016-10-31 ENCOUNTER — Ambulatory Visit (INDEPENDENT_AMBULATORY_CARE_PROVIDER_SITE_OTHER): Payer: BLUE CROSS/BLUE SHIELD | Admitting: Physician Assistant

## 2016-10-31 VITALS — BP 130/80 | HR 110 | Temp 98.6°F | Resp 17 | Ht 65.0 in | Wt 247.0 lb

## 2016-10-31 DIAGNOSIS — J310 Chronic rhinitis: Secondary | ICD-10-CM

## 2016-10-31 DIAGNOSIS — J0101 Acute recurrent maxillary sinusitis: Secondary | ICD-10-CM | POA: Diagnosis not present

## 2016-10-31 MED ORDER — BENZONATATE 100 MG PO CAPS: 100.0000 mg | ORAL_CAPSULE | Freq: Three times a day (TID) | ORAL | 0 refills | Status: DC | PRN

## 2016-10-31 MED ORDER — FLUTICASONE PROPIONATE 50 MCG/ACT NA SUSP: 2.0000 | Freq: Every day | NASAL | 12 refills | Status: DC

## 2016-10-31 MED ORDER — AZELASTINE HCL 0.15 % NA SOLN: 2.0000 | Freq: Two times a day (BID) | NASAL | 0 refills | Status: AC

## 2016-10-31 MED ORDER — AMOXICILLIN-POT CLAVULANATE 875-125 MG PO TABS: 1.0000 | ORAL_TABLET | Freq: Two times a day (BID) | ORAL | 0 refills | Status: AC

## 2016-10-31 NOTE — Patient Instructions (Addendum)
Start using all four of the medications I've sent to the pharmacy. The tessalon perles (benzonatate) can be stopped when you are not coughing. Stop the antibiotic when the pills are gone. Once your symptoms were well controlled, stop the azelastine nasal spray. Continue the Flonase nasal spray. If your symptoms begin to recur, restart the azelastine nasal spray and/or an OTC antihistamine (like Claritin, Allegra or Zyrtec).    IF you received an x-ray today, you will receive an invoice from [facility]. Please contact [facility] at [phone] with questions or concerns regarding your invoice.   IF you received labwork today, you will receive an invoice from [facility]. Please contact [facility] at [phone] with questions or concerns regarding your invoice.   Our billing staff will not be able to assist you with questions regarding bills from these companies.  You will be contacted with the lab results as soon as they are available. The fastest way to get your results is to activate your My Chart account. Instructions are located on the last page of this paperwork. If you have not heard from us regarding the results in 2 weeks, please contact this office.

## 2016-10-31 NOTE — Progress Notes (Signed)
Patient ID: [name], female    DOB: Sep 07, 1961, 55 y.o.   MRN: [id number]  PCP: No PCP Per Patient  Chief Complaint  Patient presents with  . URI  . Sore Throat    Subjective:   Presents for evaluation of upper respiratory symptoms X >7 days.  Feels like a cold, but every time she feels like she's getting better, she gets sick again.  Chest congestion, sore throat, fatigue, nasal/sinus symptoms. Fever/chills. Cough sometimes productive of sputum. No nausea/vomiting or diarrhea. A little bit of dizziness, mild HA (but gets a HA when she doesn't eat, and her appetite has been less due to the sore throat. No atypical muscle or joint pain.  Intermittently since last year, at least 4 times. Seems like she gets completely well in between episodes.  Similar to bronchitis she had last fall.  Has not had this season's flu vaccine because she hasn't felt well enough.   Review of Systems As above.    There are no active problems to display for this patient.    Prior to Admission medications   Medication Sig Start Date End Date Taking? Authorizing Provider  BIOTIN PO Take by mouth.   Yes Historical Provider, MD  ibuprofen (ADVIL,MOTRIN) 200 MG tablet Take 400 mg by mouth every 6 (six) hours as needed.   Yes Historical Provider, MD  Multiple Vitamins-Minerals (MULTIVITAMIN WITH MINERALS) tablet Take 1 tablet by mouth daily.   Yes Historical Provider, MD  vitamin B-12 (CYANOCOBALAMIN) 100 MCG tablet Take 100 mcg by mouth daily.   Yes Historical Provider, MD     No Known Allergies     Objective:  Physical Exam  Constitutional: She is oriented to person, place, and time. She appears well-developed and well-nourished. No distress.  BP 130/80 (BP Location: Right Arm, Patient Position: Sitting, Cuff Size: Large)   Pulse (!) 110   Temp 98.6 F (37 C) (Oral)   Resp 17   Ht 5\' 5"  (1.651 m)   Wt 247 lb (112 kg)   LMP 10/31/2016 (Approximate)   SpO2 98%   BMI 41.10  kg/m    HENT:  Head: Normocephalic and atraumatic.  Right Ear: Hearing, tympanic membrane, external ear and ear canal normal.  Left Ear: Hearing, tympanic membrane, external ear and ear canal normal.  Nose: Mucosal edema present. No rhinorrhea.  No foreign bodies. Right sinus exhibits maxillary sinus tenderness. Right sinus exhibits no frontal sinus tenderness. Left sinus exhibits maxillary sinus tenderness. Left sinus exhibits no frontal sinus tenderness.  Mouth/Throat: Uvula is midline and mucous membranes are normal. No uvula swelling. Posterior oropharyngeal erythema (mild) present. No oropharyngeal exudate, posterior oropharyngeal edema or tonsillar abscesses.  Eyes: Conjunctivae and EOM are normal. Pupils are equal, round, and reactive to light. Right eye exhibits no discharge. Left eye exhibits no discharge. No scleral icterus.  Neck: Trachea normal, normal range of motion, full passive range of motion without pain and phonation normal. Neck supple. No thyroid mass and no thyromegaly present.  Cardiovascular: Normal rate, regular rhythm and normal heart sounds.   Pulmonary/Chest: Effort normal and breath sounds normal.  Lymphadenopathy:       Head (right side): No submandibular, no tonsillar, no preauricular, no posterior auricular and no occipital adenopathy present.       Head (left side): No submandibular, no tonsillar, no preauricular and no occipital adenopathy present.    She has no cervical adenopathy.       Right: No supraclavicular adenopathy present.  Left: No supraclavicular adenopathy present.  Neurological: She is alert and oriented to person, place, and time. She has normal strength. No cranial nerve deficit or sensory deficit.  Skin: Skin is warm, dry and intact. No rash noted.  Psychiatric: She has a normal mood and affect. Her speech is normal and behavior is normal.           Assessment & Plan:   1. Acute recurrent maxillary sinusitis I suspect that she  has chronic rhinitis, with episodic flares that sometimes become secondarily infected.  - benzonatate (TESSALON) 100 MG capsule; Take 1-2 capsules (100-200 mg total) by mouth 3 (three) times daily as needed for cough.  Dispense: 40 capsule; Refill: 0 - Azelastine HCl 0.15 % SOLN; Place 2 sprays into both nostrils 2 (two) times daily.  Dispense: 30 mL; Refill: 0 - amoxicillin-clavulanate (AUGMENTIN) 875-125 MG tablet; Take 1 tablet by mouth 2 (two) times daily.  Dispense: 20 tablet; Refill: 0  2. Chronic rhinitis, unspecified type - fluticasone (FLONASE) 50 MCG/ACT nasal spray; Place 2 sprays into both nostrils daily.  Dispense: 16 g; Refill: 12  Return when well for routine health maintenance.   Fernande Brashelle S. Johari Pinney, PA-C Physician Assistant-Certified [facility]

## 2017-05-23 ENCOUNTER — Ambulatory Visit (INDEPENDENT_AMBULATORY_CARE_PROVIDER_SITE_OTHER): Payer: BLUE CROSS/BLUE SHIELD | Admitting: Physician Assistant

## 2017-05-23 ENCOUNTER — Encounter: Payer: Self-pay | Admitting: Physician Assistant

## 2017-05-23 VITALS — BP 134/84 | HR 90 | Temp 98.7°F | Resp 16 | Ht 65.0 in | Wt 247.0 lb

## 2017-05-23 DIAGNOSIS — K5900 Constipation, unspecified: Secondary | ICD-10-CM

## 2017-05-23 DIAGNOSIS — Z86018 Personal history of other benign neoplasm: Secondary | ICD-10-CM

## 2017-05-23 DIAGNOSIS — R1031 Right lower quadrant pain: Secondary | ICD-10-CM

## 2017-05-23 MED ORDER — BISACODYL 5 MG PO TBEC: 5.0000 mg | DELAYED_RELEASE_TABLET | Freq: Every day | ORAL | 0 refills | Status: AC | PRN

## 2017-05-23 MED ORDER — BISACODYL 10 MG RE SUPP: 10.0000 mg | RECTAL | 0 refills | Status: AC | PRN

## 2017-05-23 NOTE — Progress Notes (Signed)
[name]  MRN: [id number] DOB: [birth year]  PCP: Patient, No Pcp Per  Subjective:  Pt is a 56 year old female who presents to clinic for abdominal cramping x 1 day. Yesterday "sharp pains in the bottom" and feeling bloated. "Soft" bowel movement yesterday.  She feels better today compared to yesterday. Tenderness has reduced.  She has bowel movements every other day. She has to strain some. Endorses well formed stools. No blood in stool. She eats hot dogs, bacon, eggs, grits, toast, chicken nuggets, soda. Denies fever, chills, n/v/d, reduced appetite, weight loss.     She would like a referral to GYN. She does not have one and would like to establish care. She has a h/o uterine fibroids. She endorses RLQ cramping with her periods.   Review of Systems  Constitutional: Negative for chills, diaphoresis, fatigue and fever.  HENT: Negative for congestion, postnasal drip, rhinorrhea, sinus pressure, sneezing and sore throat.   Respiratory: Negative for cough, chest tightness, shortness of breath and wheezing.   Cardiovascular: Negative for chest pain and palpitations.  Gastrointestinal: Positive for abdominal pain and diarrhea. Negative for constipation, nausea and vomiting.  Neurological: Negative for weakness, light-headedness and headaches.    There are no active problems to display for this patient.   Current Outpatient Prescriptions on File Prior to Visit  Medication Sig Dispense Refill  . fluticasone (FLONASE) 50 MCG/ACT nasal spray Place 2 sprays into both nostrils daily. 16 g 12  . ibuprofen (ADVIL,MOTRIN) 200 MG tablet Take 400 mg by mouth every 6 (six) hours as needed.    . Multiple Vitamins-Minerals (MULTIVITAMIN WITH MINERALS) tablet Take 1 tablet by mouth daily.    . vitamin B-12 (CYANOCOBALAMIN) 100 MCG tablet Take 100 mcg by mouth daily.    . Azelastine HCl 0.15 % SOLN Place 2 sprays into both nostrils 2 (two) times daily. (Patient not taking: Reported on 05/23/2017)  30 mL 0  . benzonatate (TESSALON) 100 MG capsule Take 1-2 capsules (100-200 mg total) by mouth 3 (three) times daily as needed for cough. (Patient not taking: Reported on 05/23/2017) 40 capsule 0  . BIOTIN PO Take by mouth.     No current facility-administered medications on file prior to visit.     No Known Allergies   Objective:  BP 134/84   Pulse 90   Temp 98.7 F (37.1 C) (Oral)   Resp 16   Ht 5\' 5"  (1.651 m)   Wt 247 lb (112 kg)   LMP 04/29/2017   SpO2 95%   BMI 41.10 kg/m   Physical Exam  Constitutional: She is oriented to person, place, and time and well-developed, well-nourished, and in no distress. No distress.  Cardiovascular: Normal rate, regular rhythm and normal heart sounds.   Abdominal: Soft. Normal appearance and bowel sounds are normal. There is no hepatosplenomegaly. There is generalized tenderness. There is negative Murphy's sign.  Neurological: She is alert and oriented to person, place, and time. GCS score is 15.  Skin: Skin is warm and dry.  Psychiatric: Mood, memory, affect and judgment normal.  Vitals reviewed.   Assessment and Plan :  1. RLQ abdominal pain 2. Constipation, unspecified constipation type - bisacodyl (DULCOLAX) 10 MG suppository; Place 1 suppository (10 mg total) rectally as needed for moderate constipation.  Dispense: 12 suppository; Refill: 0 - bisacodyl (DULCOLAX) 5 MG EC tablet; Take 1 tablet (5 mg total) by mouth daily as needed for moderate constipation.  Dispense: 30 tablet; Refill: 0 - Dulcolax +  Dulcolax suppositories. take 2 Dulcolax tablets now and repeat tonight. If no bowel movement she will take 2 Dulcolax in the morning and 2 tomorrow night. She is to use a Dulcolax suppository now repeat tonight and repeat in the morning and evening tomorrow if she does not get results. She agrees with plan. Constipation information printed out for pt. RTC if no improvement in 1-2 weeks.  3. History of uterine fibroid - Ambulatory referral  to Gynecology  Marco CollieWhitney Simon Llamas, PA-C  [facility] 05/23/2017 11:43 AM

## 2017-05-23 NOTE — Patient Instructions (Addendum)
Dulcolax + Dulcolax suppositories. take 2 Dulcolax tablets now and repeat tonight. If no bowel movement, take 2 Dulcolax in the morning and 2 tomorrow night.  Use a Dulcolax suppository now repeat tonight and repeat in the morning and evening tomorrow if you do not get results.  Come back if you are not better in 5-7 days. Please see below information to prevent constipation.   Thank you for coming in today. I hope you feel we met your needs.  Feel free to call [facility] if you have any questions or further requests.  Please consider signing up for MyChart if you do not already have it, as this is a great way to communicate with me.  Best,  Whitney McVey, PA-C    Constipation, Adult Constipation is when a person has fewer bowel movements in a week than normal, has difficulty having a bowel movement, or has stools that are dry, hard, or larger than normal. Constipation may be caused by an underlying condition. It may become worse with age if a person takes certain medicines and does not take in enough fluids. Follow these instructions at home: Eating and drinking   Eat foods that have a lot of fiber, such as fresh fruits and vegetables, whole grains, and beans.  Limit foods that are high in fat, low in fiber, or overly processed, such as french fries, hamburgers, cookies, candies, and soda.  Drink enough fluid to keep your urine clear or pale yellow. General instructions  Exercise regularly or as told by your health care provider.  Go to the restroom when you have the urge to go. Do not hold it in.  Take over-the-counter and prescription medicines only as told by your health care provider. These include any fiber supplements.  Practice pelvic floor retraining exercises, such as deep breathing while relaxing the lower abdomen and pelvic floor relaxation during bowel movements.  Watch your condition for any changes.  Keep all follow-up visits as told by your health care provider. This is  important. Contact a health care provider if:  You have pain that gets worse.  You have a fever.  You do not have a bowel movement after 4 days.  You vomit.  You are not hungry.  You lose weight.  You are bleeding from the anus.  You have thin, pencil-like stools. Get help right away if:  You have a fever and your symptoms suddenly get worse.  You leak stool or have blood in your stool.  Your abdomen is bloated.  You have severe pain in your abdomen.  You feel dizzy or you faint. This information is not intended to replace advice given to you by your health care provider. Make sure you discuss any questions you have with your health care provider. Document Released: 08/24/2004 Document Revised: 06/15/2016 Document Reviewed: 05/16/2016 Elsevier Interactive Patient Education  2017 [employer].  IF you received an x-ray today, you will receive an invoice from [facility]. Please contact [facility] at [phone] with questions or concerns regarding your invoice.   IF you received labwork today, you will receive an invoice from [facility]. Please contact [facility] at [phone] with questions or concerns regarding your invoice.   Our billing staff will not be able to assist you with questions regarding bills from these companies.  You will be contacted with the lab results as soon as they are available. The fastest way to get your results is to activate your My Chart account. Instructions are located on the last  page of this paperwork. If you have not heard from [place] regarding the results in 2 weeks, please contact this office.

## 2017-11-20 ENCOUNTER — Other Ambulatory Visit: Payer: Self-pay | Admitting: Physician Assistant

## 2017-11-20 DIAGNOSIS — J310 Chronic rhinitis: Secondary | ICD-10-CM

## 2018-09-30 ENCOUNTER — Encounter (HOSPITAL_COMMUNITY): Payer: Self-pay | Admitting: Emergency Medicine

## 2018-09-30 ENCOUNTER — Ambulatory Visit (HOSPITAL_COMMUNITY)
Admission: EM | Admit: 2018-09-30 | Discharge: 2018-09-30 | Disposition: A | Discharge status: Home / Self Care | Payer: BLUE CROSS/BLUE SHIELD | Attending: Family Medicine | Admitting: Family Medicine

## 2018-09-30 DIAGNOSIS — I10 Essential (primary) hypertension: Secondary | ICD-10-CM | POA: Diagnosis not present

## 2018-09-30 NOTE — Discharge Instructions (Addendum)
I am giving you some information about hypertension and low-sodium diet. I am also given you contact for primary care provider Monitor your blood pressures at home If you develop any symptoms to include dizziness, headache, chest pain, shortness of breath please go to the ER. I believe that is reasonable to try diet and exercise with weight loss before starting blood pressure medication.

## 2018-09-30 NOTE — ED Triage Notes (Signed)
PT went to the dentist and was told to come be seen for HTN. PT is asymptomatic.

## 2018-09-30 NOTE — ED Provider Notes (Signed)
[facility]    CSN: [id number] Arrival date & time: 09/30/18  2130     History   Chief Complaint Chief Complaint  Patient presents with  . Hypertension    HPI [name] is a 57 y.o. female.   Patient is a 57 year old female with no significant past medical history the presents for elevated blood pressure.  She went for a dental procedure this morning and was found to have multiple elevated blood pressures ranging from 137/109 to 159/101.  She has no known history of follow-up blood pressure but does have a family history of high blood pressure.  She denies any associated chest pain, shortness of breath, palpitations, dizziness, headache, blurred vision, weakness, numbness or tingling.  Her diet is high in sodium and sometimes she does get some bilateral lower extremity swelling.  She does not smoke.   ROS per HPI      History reviewed. No pertinent past medical history.  There are no active problems to display for this patient.   Past Surgical History:  Procedure Laterality Date  . UTERINE FIBROID SURGERY  2008    OB History   None      Home Medications    Prior to Admission medications   Medication Sig Start Date End Date Taking? Authorizing Provider  ibuprofen (ADVIL,MOTRIN) 200 MG tablet Take 400 mg by mouth every 6 (six) hours as needed.   Yes [provider]  Multiple Vitamins-Minerals (MULTIVITAMIN WITH MINERALS) tablet Take 1 tablet by mouth daily.   Yes [provider]  Azelastine HCl 0.15 % SOLN Place 2 sprays into both nostrils 2 (two) times daily. Patient not taking: Reported on 05/23/2017 10/31/16   Porfirio Oar, PA  benzonatate (TESSALON) 100 MG capsule Take 1-2 capsules (100-200 mg total) by mouth 3 (three) times daily as needed for cough. Patient not taking: Reported on 05/23/2017 10/31/16   Porfirio Oar, PA  BIOTIN PO Take by mouth.    [provider]  bisacodyl (DULCOLAX) 10 MG suppository Place 1  suppository (10 mg total) rectally as needed for moderate constipation. 05/23/17   McVey, Madelaine Bhat, PA-C  bisacodyl (DULCOLAX) 5 MG EC tablet Take 1 tablet (5 mg total) by mouth daily as needed for moderate constipation. 05/23/17   McVey, Madelaine Bhat, PA-C  fluticasone (FLONASE) 50 MCG/ACT nasal spray USE 2 SPRAYS IN EACH NOSTRIL EVERY DAY 11/20/17   Porfirio Oar, PA  vitamin B-12 (CYANOCOBALAMIN) 100 MCG tablet Take 100 mcg by mouth daily.    [provider]    Family History Family History  Problem Relation Age of Onset  . Arthritis Mother   . Hypertension Mother   . Hypertension Father   . Heart disease Father   . Hypertension Sister   . Thyroid disease Sister     Social History Social History   Tobacco Use  . Smoking status: Never Smoker  . Smokeless tobacco: Never Used  Substance Use Topics  . Alcohol use: No    Alcohol/week: 0.0 standard drinks  . Drug use: No     Allergies   Patient has no known allergies.   Review of Systems Review of Systems   Physical Exam Triage Vital Signs ED Triage Vitals  Enc Vitals Group     BP 09/30/18 0950 (!) 157/99     Pulse Rate 09/30/18 0950 85     Resp 09/30/18 0950 16     Temp 09/30/18 0950 97.9 F (36.6 C)     Temp  Source 09/30/18 0950 Oral     SpO2 09/30/18 0950 97 %     Weight 09/30/18 0951 260 lb (117.9 kg)     Height --      Head Circumference --      Peak Flow --      Pain Score 09/30/18 0951 0     Pain Loc --      Pain Edu? --      Excl. in GC? --    No data found.  Updated Vital Signs BP (!) 157/99 (BP Location: Right Arm)   Pulse 85   Temp 97.9 F (36.6 C) (Oral)   Resp 16   Wt 260 lb (117.9 kg)   LMP 09/25/2018   SpO2 97%   BMI 43.27 kg/m   Visual Acuity Right Eye Distance:   Left Eye Distance:   Bilateral Distance:    Right Eye Near:   Left Eye Near:    Bilateral Near:     Physical Exam  Constitutional: She is oriented to person, place, and time. She appears  well-developed and well-nourished.  Very pleasant. Non toxic or ill appearing.   HENT:  Head: Normocephalic and atraumatic.  Right Ear: External ear normal.  Left Ear: External ear normal.  Nose: Nose normal.  Eyes: Pupils are equal, round, and reactive to light. Conjunctivae and EOM are normal.  Neck: Normal range of motion.  Cardiovascular: Normal rate, regular rhythm, normal heart sounds and intact distal pulses.  Pulmonary/Chest: Effort normal and breath sounds normal.  Musculoskeletal: Normal range of motion.  Neurological: She is alert and oriented to person, place, and time. No sensory deficit.  No focal neuro deficits  Skin: Skin is warm and dry.  Psychiatric: She has a normal mood and affect.  Nursing note and vitals reviewed.    UC Treatments / Results  Labs (all labs ordered are listed, but only abnormal results are displayed) Labs Reviewed - No data to display  EKG None  Radiology No results found.  Procedures Procedures (including critical care time)  Medications Ordered in UC Medications - No data to display  Initial Impression / Assessment and Plan / UC Course  I have reviewed the triage vital signs and the nursing notes.  Pertinent labs & imaging results that were available during my care of the patient were reviewed by me and considered in my medical decision making (see chart for details).     Pt is a 57 year old female that presents for elevated blood pressure reading at the dentist office.  Her blood pressure is slightly elevated here.  Her exam is normal with no focal neuro deficits.  She has no concerning signs or symptoms on HPI.  Spoke with patient about starting on low-dose blood pressure medication based on consistent elevated blood pressures over the last few doctors visits.  Patient hesitant about starting blood pressure medication and wanted to try low-sodium diet, weight loss and exercise first. This is reasonable.  Gave her a few contacts  for primary care providers for further management. Educated on getting a at home blood pressure cuff and monitoring blood pressures daily. If her blood pressure reaches very high limits and she starts developing any concerning signs or symptoms she will need to go the ER. Final Clinical Impressions(s) / UC Diagnoses   Final diagnoses:  Essential hypertension     Discharge Instructions     I am giving you some information about hypertension and low-sodium diet. I am also given you contact for  primary care provider Monitor your blood pressures at home If you develop any symptoms to include dizziness, headache, chest pain, shortness of breath please go to the ER. I believe that is reasonable to try diet and exercise with weight loss before starting blood pressure medication.    ED Prescriptions    None     Controlled Substance Prescriptions [place] Controlled Substance Registry consulted? No   Janace Aris, NP 09/30/18 1123

## 2020-02-24 DIAGNOSIS — Z20828 Contact with and (suspected) exposure to other viral communicable diseases: Secondary | ICD-10-CM | POA: Diagnosis not present

## 2020-03-11 ENCOUNTER — Ambulatory Visit: Payer: BC Managed Care – PPO | Attending: Internal Medicine

## 2020-03-11 DIAGNOSIS — Z23 Encounter for immunization: Secondary | ICD-10-CM

## 2020-03-11 NOTE — Progress Notes (Signed)
   Covid-19 Vaccination Clinic  Name:  [name]    MRN: [id number] DOB: 1961-05-24  03/11/2020  [name] was observed post Covid-19 immunization for 15 minutes without incident. She was provided with Vaccine Information Sheet and instruction to access the V-Safe system.   [name] was instructed to call 911 with any severe reactions post vaccine: Marland Kitchen Difficulty breathing  . Swelling of face and throat  . A fast heartbeat  . A bad rash all over body  . Dizziness and weakness   Immunizations Administered    Name Date Dose VIS Date Route   Pfizer COVID-19 Vaccine 03/11/2020  4:10 PM 0.3 mL 11/20/2019 Intramuscular   Manufacturer: [employer], [employer]   Lot: [id number]   NDC: [address]-3

## 2020-04-05 ENCOUNTER — Ambulatory Visit: Payer: BC Managed Care – PPO | Attending: Internal Medicine

## 2020-04-05 DIAGNOSIS — Z23 Encounter for immunization: Secondary | ICD-10-CM

## 2020-04-05 NOTE — Progress Notes (Signed)
   Covid-19 Vaccination Clinic  Name:  [name]    MRN: [id number] DOB: 12/01/1961  04/05/2020  [name] was observed post Covid-19 immunization for 15 minutes without incident. She was provided with Vaccine Information Sheet and instruction to access the V-Safe system.   [name] was instructed to call 911 with any severe reactions post vaccine: Marland Kitchen Difficulty breathing  . Swelling of face and throat  . A fast heartbeat  . A bad rash all over body  . Dizziness and weakness   Immunizations Administered    Name Date Dose VIS Date Route   Pfizer COVID-19 Vaccine 04/05/2020 10:46 AM 0.3 mL 02/03/2019 Intramuscular   Manufacturer: [employer], [employer]   Lot: [id number]   NDC: [address]-1

## 2021-02-09 DIAGNOSIS — Z01812 Encounter for preprocedural laboratory examination: Secondary | ICD-10-CM | POA: Diagnosis not present

## 2021-02-14 DIAGNOSIS — K640 First degree hemorrhoids: Secondary | ICD-10-CM | POA: Diagnosis not present

## 2021-02-14 DIAGNOSIS — Z1211 Encounter for screening for malignant neoplasm of colon: Secondary | ICD-10-CM | POA: Diagnosis not present

## 2021-02-14 DIAGNOSIS — K5730 Diverticulosis of large intestine without perforation or abscess without bleeding: Secondary | ICD-10-CM | POA: Diagnosis not present

## 2021-06-08 ENCOUNTER — Ambulatory Visit (INDEPENDENT_AMBULATORY_CARE_PROVIDER_SITE_OTHER): Payer: BC Managed Care – PPO

## 2021-06-08 ENCOUNTER — Encounter: Payer: Self-pay | Admitting: Sports Medicine

## 2021-06-08 ENCOUNTER — Other Ambulatory Visit: Payer: Self-pay

## 2021-06-08 ENCOUNTER — Ambulatory Visit (INDEPENDENT_AMBULATORY_CARE_PROVIDER_SITE_OTHER): Payer: BC Managed Care – PPO | Admitting: Sports Medicine

## 2021-06-08 DIAGNOSIS — M79672 Pain in left foot: Secondary | ICD-10-CM | POA: Diagnosis not present

## 2021-06-08 DIAGNOSIS — M722 Plantar fascial fibromatosis: Secondary | ICD-10-CM

## 2021-06-08 MED ORDER — TRIAMCINOLONE ACETONIDE 10 MG/ML IJ SUSP
10.0000 mg | Freq: Once | INTRAMUSCULAR | Status: AC
  Administered 2021-06-08: 10 mg

## 2021-06-08 NOTE — Progress Notes (Signed)
Subjective: [name] is a 60 y.o. female patient presents to office with complaint of moderate heel pain on the left. Patient admits to post static dyskinesia for months in duration. Patient has treated this problem with stretching inserts with no relief. Now wearing steel toed boots. Admits to past heel pain and requests injection. Denies any other pedal complaints.   There are no problems to display for this patient.   Current Outpatient Medications on File Prior to Visit  Medication Sig Dispense Refill   Azelastine HCl 0.15 % SOLN Place 2 sprays into both nostrils 2 (two) times daily. (Patient not taking: Reported on 05/23/2017) 30 mL 0   benzonatate (TESSALON) 100 MG capsule Take 1-2 capsules (100-200 mg total) by mouth 3 (three) times daily as needed for cough. (Patient not taking: Reported on 05/23/2017) 40 capsule 0   BIOTIN PO Take by mouth.     bisacodyl (DULCOLAX) 10 MG suppository Place 1 suppository (10 mg total) rectally as needed for moderate constipation. 12 suppository 0   bisacodyl (DULCOLAX) 5 MG EC tablet Take 1 tablet (5 mg total) by mouth daily as needed for moderate constipation. 30 tablet 0   fluticasone (FLONASE) 50 MCG/ACT nasal spray USE 2 SPRAYS IN EACH NOSTRIL EVERY DAY 16 g 2   ibuprofen (ADVIL,MOTRIN) 200 MG tablet Take 400 mg by mouth every 6 (six) hours as needed.     Multiple Vitamins-Minerals (MULTIVITAMIN WITH MINERALS) tablet Take 1 tablet by mouth daily.     polyethylene glycol-electrolytes (NULYTELY) 420 g solution Take by mouth.     vitamin B-12 (CYANOCOBALAMIN) 100 MCG tablet Take 100 mcg by mouth daily.     No current facility-administered medications on file prior to visit.    No Known Allergies  Objective: Physical Exam General: The patient is alert and oriented x3 in no acute distress.  Dermatology: Skin is warm, dry and supple bilateral lower extremities. Nails 1-10 are normal. There is no erythema, edema, no eccymosis, no open lesions  present. Integument is otherwise unremarkable.  Vascular: Dorsalis Pedis pulse and Posterior Tibial pulse are 2/4 bilateral. Capillary fill time is immediate to all digits.  Neurological: Grossly intact to light touch bilateral.  Musculoskeletal: Tenderness to palpation at the medial calcaneal tubercale and through the insertion of the plantar fascia on the left foot. No pain with compression of calcaneus bilateral. No pain with tuning fork to calcaneus bilateral. No pain with calf compression bilateral. There is decreased Ankle joint range of motion bilateral. All other joints range of motion within normal limits bilateral. Strength 5/5 in all groups bilateral.   Gait: Unassisted, Antalgic avoid weight on left heel  Xray, Left foot:  Normal osseous mineralization. Joint spaces preserved. No fracture/dislocation/boney destruction. Calcaneal spur present with mild thickening of plantar fascia. No other soft tissue abnormalities or radiopaque foreign bodies.   Assessment and Plan: Problem List Items Addressed This Visit   None Visit Diagnoses     Plantar fasciitis of left foot    -  Primary   Relevant Medications   triamcinolone acetonide (KENALOG) 10 MG/ML injection 10 mg (Start on 06/08/2021  1:30 PM)   Other Relevant Orders   DG Foot Complete Left   Pain of left heel           -Complete examination performed.  -Xrays reviewed -Discussed with patient in detail the condition of plantar fasciitis, how this occurs and general treatment options. Explained both conservative and surgical treatments.  -After oral consent and aseptic  prep, injected a mixture containing 1 ml of 2%  plain lidocaine, 1 ml 0.5% plain marcaine, 0.5 ml of kenalog 10 and 0.5 ml of dexamethasone phosphate into left heel. Post-injection care discussed with patient.  -Recommended good supportive shoes and advised use of OTC inserts -Explained and dispensed to patient daily stretching exercises. -Recommend patient to  ice affected area 1-2x daily. -Patient to return to office if fails to resolve after injection or sooner if problems or questions arise.  [name], DPM

## 2021-06-24 DIAGNOSIS — Z20822 Contact with and (suspected) exposure to covid-19: Secondary | ICD-10-CM | POA: Diagnosis not present

## 2021-06-28 ENCOUNTER — Ambulatory Visit (HOSPITAL_COMMUNITY)
Admission: EM | Admit: 2021-06-28 | Discharge: 2021-06-28 | Disposition: A | Discharge status: Home / Self Care | Payer: BC Managed Care – PPO | Attending: Internal Medicine | Admitting: Internal Medicine

## 2021-06-28 ENCOUNTER — Other Ambulatory Visit: Payer: Self-pay

## 2021-06-28 ENCOUNTER — Encounter (HOSPITAL_COMMUNITY): Payer: Self-pay | Admitting: Emergency Medicine

## 2021-06-28 DIAGNOSIS — U071 COVID-19: Secondary | ICD-10-CM | POA: Diagnosis not present

## 2021-06-28 DIAGNOSIS — J029 Acute pharyngitis, unspecified: Secondary | ICD-10-CM

## 2021-06-28 DIAGNOSIS — R0602 Shortness of breath: Secondary | ICD-10-CM | POA: Insufficient documentation

## 2021-06-28 DIAGNOSIS — R059 Cough, unspecified: Secondary | ICD-10-CM | POA: Diagnosis not present

## 2021-06-28 LAB — POCT RAPID STREP A, ED / UC: Streptococcus, Group A Screen (Direct): NEGATIVE

## 2021-06-28 MED ORDER — PREDNISONE 20 MG PO TABS: 40.0000 mg | ORAL_TABLET | Freq: Every day | ORAL | 0 refills | Status: AC

## 2021-06-28 NOTE — ED Provider Notes (Signed)
[facility]    CSN: [id number] Arrival date & time: 06/28/21  6160      History   Chief Complaint Chief Complaint  Patient presents with   Fever   Sore Throat    HPI [name] is a 60 y.o. female.   Patient presents to the urgent care with 1 week history of sore throat, fever, cough.  T-max at home was 99.3.  Patient states that chest pain occurs only with coughing.  Also has intermittent shortness of breath.  Denies any current shortness of breath or chest pain.  Patient had positive at-home COVID test about 5 days ago.  Has taken over-the-counter Advil cold and sinus, Aleve, and Tylenol with no relief of symptoms.   Fever Sore Throat   History reviewed. No pertinent past medical history.  There are no problems to display for this patient.   Past Surgical History:  Procedure Laterality Date   UTERINE FIBROID SURGERY  2008    OB History   No obstetric history on file.      Home Medications    Prior to Admission medications   Medication Sig Start Date End Date Taking? Authorizing Provider  predniSONE (DELTASONE) 20 MG tablet Take 2 tablets (40 mg total) by mouth daily for 5 days. 06/28/21 07/03/21 Yes Lance Muss, FNP  Azelastine HCl 0.15 % SOLN Place 2 sprays into both nostrils 2 (two) times daily. Patient not taking: No sig reported 10/31/16   Porfirio Oar, PA  benzonatate (TESSALON) 100 MG capsule Take 1-2 capsules (100-200 mg total) by mouth 3 (three) times daily as needed for cough. Patient not taking: No sig reported 10/31/16   Porfirio Oar, PA  BIOTIN PO Take by mouth.    [provider]  bisacodyl (DULCOLAX) 10 MG suppository Place 1 suppository (10 mg total) rectally as needed for moderate constipation. 05/23/17   McVey, Madelaine Bhat, PA-C  bisacodyl (DULCOLAX) 5 MG EC tablet Take 1 tablet (5 mg total) by mouth daily as needed for moderate constipation. 05/23/17   McVey, Madelaine Bhat, PA-C  fluticasone (FLONASE)  50 MCG/ACT nasal spray USE 2 SPRAYS IN EACH NOSTRIL EVERY DAY 11/20/17   Porfirio Oar, PA  ibuprofen (ADVIL,MOTRIN) 200 MG tablet Take 400 mg by mouth every 6 (six) hours as needed.    [provider]  Multiple Vitamins-Minerals (MULTIVITAMIN WITH MINERALS) tablet Take 1 tablet by mouth daily.    [provider]  polyethylene glycol-electrolytes (NULYTELY) 420 g solution Take by mouth. 01/20/21   [provider]  vitamin B-12 (CYANOCOBALAMIN) 100 MCG tablet Take 100 mcg by mouth daily.    [provider]    Family History Family History  Problem Relation Age of Onset   Arthritis Mother    Hypertension Mother    Hypertension Father    Heart disease Father    Hypertension Sister    Thyroid disease Sister     Social History Social History   Tobacco Use   Smoking status: Never   Smokeless tobacco: Never  Substance Use Topics   Alcohol use: No    Alcohol/week: 0.0 standard drinks   Drug use: No     Allergies   Patient has no known allergies.   Review of Systems Review of Systems Per HPI  Physical Exam Triage Vital Signs ED Triage Vitals  Enc Vitals Group     BP 06/28/21 1146 (!) 161/82     Pulse Rate 06/28/21 1146 90     Resp  06/28/21 1146 18     Temp 06/28/21 1146 98.4 F (36.9 C)     Temp Source 06/28/21 1146 Oral     SpO2 06/28/21 1146 97 %     Weight --      Height --      Head Circumference --      Peak Flow --      Pain Score 06/28/21 1143 6     Pain Loc --      Pain Edu? --      Excl. in GC? --    No data found.  Updated Vital Signs BP (!) 161/82 (BP Location: Left Arm)   Pulse 90   Temp 98.4 F (36.9 C) (Oral)   Resp 18   LMP 05/10/2021 (Approximate)   SpO2 97%   Visual Acuity Right Eye Distance:   Left Eye Distance:   Bilateral Distance:    Right Eye Near:   Left Eye Near:    Bilateral Near:     Physical Exam Constitutional:      General: She is not in acute distress.    Appearance: Normal  appearance.  HENT:     Head: Normocephalic and atraumatic.     Right Ear: Tympanic membrane and ear canal normal.     Left Ear: Tympanic membrane and ear canal normal.     Nose: Congestion present.     Mouth/Throat:     Mouth: Mucous membranes are moist.     Pharynx: Posterior oropharyngeal erythema present.  Eyes:     Extraocular Movements: Extraocular movements intact.     Conjunctiva/sclera: Conjunctivae normal.     Pupils: Pupils are equal, round, and reactive to light.  Cardiovascular:     Rate and Rhythm: Normal rate and regular rhythm.     Pulses: Normal pulses.     Heart sounds: Normal heart sounds.  Pulmonary:     Effort: Pulmonary effort is normal. No respiratory distress.     Breath sounds: Normal breath sounds. No wheezing, rhonchi or rales.  Abdominal:     General: Abdomen is flat. Bowel sounds are normal.     Palpations: Abdomen is soft.  Musculoskeletal:        General: Normal range of motion.     Cervical back: Normal range of motion.  Skin:    General: Skin is warm and dry.  Neurological:     General: No focal deficit present.     Mental Status: She is alert and oriented to person, place, and time. Mental status is at baseline.  Psychiatric:        Mood and Affect: Mood normal.        Behavior: Behavior normal.     UC Treatments / Results  Labs (all labs ordered are listed, but only abnormal results are displayed) Labs Reviewed  CULTURE, GROUP A STREP [facility])  POCT RAPID STREP A, ED / UC    EKG   Radiology No results found.  Procedures Procedures (including critical care time)  Medications Ordered in UC Medications - No data to display  Initial Impression / Assessment and Plan / UC Course  I have reviewed the triage vital signs and the nursing notes.  Pertinent labs & imaging results that were available during my care of the patient were reviewed by me and considered in my medical decision making (see chart for details).     Suggested  chest x-ray to patient to evaluate for any acute cardiopulmonary causes of shortness of breath.  Patient declined x-ray at this time.  Will treat with prednisone x5 days to decrease inflammation and help with cough.  Patient advised to take Mucinex and/or Coricidin HBP to help alleviate symptoms as well.  Patient to avoid any medication that ends in D or DM to avoid raising blood pressure.  Advised patient to go to the hospital if shortness of breath worsens or if chest pain is constant and does not occur only with coughing.  Rapid strep test was negative in urgent care.  Throat culture is pending.  Discussed strict return precautions. Patient verbalized understanding and is agreeable with plan.  Final Clinical Impressions(s) / UC Diagnoses   Final diagnoses:  COVID-19  Pharyngitis, unspecified etiology  Cough  Shortness of breath     Discharge Instructions      Please go to the hospital if shortness of breath worsens or if chest pain worsens.  You are being treated with prednisone steroid to decrease inflammation and help with cough.  You may also take Mucinex or Coricidin HBP over-the-counter.  Please avoid any medications that ended in D or DM as they may increase your blood pressure.  You may take Cepacol throat lozenges over-the-counter to help with sore throat.  Please continue to monitor your fevers and treat as needed with Tylenol.  Rapid strep test was negative in urgent care today.  Throat culture is pending.  We will call if this is positive.     ED Prescriptions     Medication Sig Dispense Auth. Provider   predniSONE (DELTASONE) 20 MG tablet Take 2 tablets (40 mg total) by mouth daily for 5 days. 10 tablet Lance Muss, FNP      PDMP not reviewed this encounter.   Lance Muss, FNP 06/28/21 1314

## 2021-06-28 NOTE — Discharge Instructions (Addendum)
Please go to the hospital if shortness of breath worsens or if chest pain worsens.  You are being treated with prednisone steroid to decrease inflammation and help with cough.  You may also take Mucinex or Coricidin HBP over-the-counter.  Please avoid any medications that ended in D or DM as they may increase your blood pressure.  You may take Cepacol throat lozenges over-the-counter to help with sore throat.  Please continue to monitor your fevers and treat as needed with Tylenol.  Rapid strep test was negative in urgent care today.  Throat culture is pending.  We will call if this is positive.

## 2021-06-28 NOTE — ED Triage Notes (Signed)
Patient c/o sore throat and fever x 1 week.   Patient endorses chest pain with inhalation and coughing.   Patient endorses a productive cough w/ "cloudy" sputum.   Patient endorses a temperature of 99.3 F at home. Patient endorses having sweats.   Patient reports a POSITIVE COVID Test from [facility] on Saturday.   Patient has taken OTC Advil cold/sinus, Aleve, and Tylenol with no relief of  symptoms.

## 2021-06-30 LAB — CULTURE, GROUP A STREP (THRC)

## 2021-07-17 DIAGNOSIS — Z23 Encounter for immunization: Secondary | ICD-10-CM | POA: Diagnosis not present

## 2021-07-17 DIAGNOSIS — Z0000 Encounter for general adult medical examination without abnormal findings: Secondary | ICD-10-CM | POA: Diagnosis not present

## 2021-07-24 ENCOUNTER — Other Ambulatory Visit: Payer: Self-pay

## 2021-07-24 ENCOUNTER — Encounter (HOSPITAL_COMMUNITY): Payer: Self-pay

## 2021-07-24 ENCOUNTER — Ambulatory Visit (HOSPITAL_COMMUNITY)
Admission: EM | Admit: 2021-07-24 | Discharge: 2021-07-24 | Disposition: A | Discharge status: Home / Self Care | Payer: BC Managed Care – PPO | Attending: Emergency Medicine | Admitting: Emergency Medicine

## 2021-07-24 DIAGNOSIS — H66002 Acute suppurative otitis media without spontaneous rupture of ear drum, left ear: Secondary | ICD-10-CM | POA: Diagnosis not present

## 2021-07-24 DIAGNOSIS — R059 Cough, unspecified: Secondary | ICD-10-CM | POA: Diagnosis not present

## 2021-07-24 MED ORDER — PROMETHAZINE-DM 6.25-15 MG/5ML PO SYRP: 5.0000 mL | ORAL_SOLUTION | Freq: Four times a day (QID) | ORAL | 0 refills | Status: AC | PRN

## 2021-07-24 MED ORDER — AMOXICILLIN-POT CLAVULANATE 875-125 MG PO TABS: 1.0000 | ORAL_TABLET | Freq: Two times a day (BID) | ORAL | 0 refills | Status: AC

## 2021-07-24 MED ORDER — BENZONATATE 100 MG PO CAPS: 200.0000 mg | ORAL_CAPSULE | Freq: Three times a day (TID) | ORAL | 0 refills | Status: AC | PRN

## 2021-07-24 NOTE — ED Triage Notes (Signed)
Pt c/o cough for a month (post Covid dx), left ear drainage (started yesterday), weakness, and sore throat.

## 2021-07-24 NOTE — Discharge Instructions (Addendum)
You can take Tylenol and/or Ibuprofen as needed for pain relief and fever reduction.    You can take the Tessalon perles as needed for cough.  You can take the Promethazine DM as needed for cough at night.  Promethazine DM can make you sleepy so do not take it prior to driving or operating heavy machinery.   You can try honey 1/2 to 1 teaspoon (you can dilute the honey in water or another fluid) to help with cough.     It is important to stay hydrated: drink plenty of fluids (water, gatorade/powerade/pedialyte, juices, or teas) to keep your throat moisturized and help further relieve irritation/discomfort.   Take the Augmentin one pill twice a day for the next 7 days to help with your ear infection.   Return or go to the Emergency Department if symptoms worsen or do not improve in the next few days.

## 2021-07-24 NOTE — ED Provider Notes (Signed)
[facility]    CSN: [id number] Arrival date & time: 07/24/21  1019      History   Chief Complaint Chief Complaint  Patient presents with   Cough    HPI [name] is a 60 y.o. female.   Patient here for evaluation of cough that has been ongoing for the past month.  Reports diagnosed with COVID about 1 month ago.  Reports cough initially improved with steroid use but has since returned and gotten worse.  Reports cough is nonproductive.  Reports chest pain associated with cough.  Has tried OTC cough medicine with minimal relief.  Also reports left ear pain and drainage that started yesterday.  Denies any trauma, injury, or other precipitating event.  Denies any specific alleviating or aggravating factors.  Denies any fevers, shortness of breath, N/V/D, numbness, tingling, weakness, abdominal pain, or headaches.    The history is provided by the patient.  Cough Associated symptoms: ear pain and sore throat   Associated symptoms: no chest pain and no shortness of breath    History reviewed. No pertinent past medical history.  There are no problems to display for this patient.   Past Surgical History:  Procedure Laterality Date   UTERINE FIBROID SURGERY  2008    OB History   No obstetric history on file.      Home Medications    Prior to Admission medications   Medication Sig Start Date End Date Taking? Authorizing Provider  amoxicillin-clavulanate (AUGMENTIN) 875-125 MG tablet Take 1 tablet by mouth every 12 (twelve) hours. 07/24/21  Yes Ivette Loyal, NP  benzonatate (TESSALON PERLES) 100 MG capsule Take 2 capsules (200 mg total) by mouth 3 (three) times daily as needed for cough. 07/24/21  Yes Ivette Loyal, NP  promethazine-dextromethorphan (PROMETHAZINE-DM) 6.25-15 MG/5ML syrup Take 5 mLs by mouth 4 (four) times daily as needed for cough. 07/24/21  Yes Ivette Loyal, NP  Azelastine HCl 0.15 % SOLN Place 2 sprays into both nostrils 2 (two) times  daily. Patient not taking: No sig reported 10/31/16   Porfirio Oar, PA  BIOTIN PO Take by mouth.    [provider]  bisacodyl (DULCOLAX) 10 MG suppository Place 1 suppository (10 mg total) rectally as needed for moderate constipation. 05/23/17   McVey, Madelaine Bhat, PA-C  bisacodyl (DULCOLAX) 5 MG EC tablet Take 1 tablet (5 mg total) by mouth daily as needed for moderate constipation. 05/23/17   McVey, Madelaine Bhat, PA-C  fluticasone (FLONASE) 50 MCG/ACT nasal spray USE 2 SPRAYS IN EACH NOSTRIL EVERY DAY 11/20/17   Porfirio Oar, PA  ibuprofen (ADVIL,MOTRIN) 200 MG tablet Take 400 mg by mouth every 6 (six) hours as needed.    [provider]  Multiple Vitamins-Minerals (MULTIVITAMIN WITH MINERALS) tablet Take 1 tablet by mouth daily.    [provider]  polyethylene glycol-electrolytes (NULYTELY) 420 g solution Take by mouth. 01/20/21   [provider]  vitamin B-12 (CYANOCOBALAMIN) 100 MCG tablet Take 100 mcg by mouth daily.    [provider]    Family History Family History  Problem Relation Age of Onset   Arthritis Mother    Hypertension Mother    Hypertension Father    Heart disease Father    Hypertension Sister    Thyroid disease Sister     Social History Social History   Tobacco Use   Smoking status: Never   Smokeless tobacco: Never  Substance Use Topics   Alcohol use: No  Alcohol/week: 0.0 standard drinks   Drug use: No     Allergies   Patient has no known allergies.   Review of Systems Review of Systems  Constitutional:  Positive for fatigue.  HENT:  Positive for ear discharge, ear pain and sore throat.   Respiratory:  Positive for cough and chest tightness. Negative for choking and shortness of breath.   Cardiovascular:  Negative for chest pain.  All other systems reviewed and are negative.   Physical Exam Triage Vital Signs ED Triage Vitals  Enc Vitals Group     BP 07/24/21 1045 (!) 149/89      Pulse Rate 07/24/21 1045 (!) 102     Resp 07/24/21 1045 20     Temp 07/24/21 1045 98.8 F (37.1 C)     Temp Source 07/24/21 1045 Oral     SpO2 07/24/21 1045 97 %     Weight --      Height --      Head Circumference --      Peak Flow --      Pain Score 07/24/21 1042 5     Pain Loc --      Pain Edu? --      Excl. in GC? --    No data found.  Updated Vital Signs BP (!) 149/89 (BP Location: Right Arm)   Pulse (!) 102   Temp 98.8 F (37.1 C) (Oral)   Resp 20   LMP 04/26/2021 (Approximate)   SpO2 97%   Visual Acuity Right Eye Distance:   Left Eye Distance:   Bilateral Distance:    Right Eye Near:   Left Eye Near:    Bilateral Near:     Physical Exam Vitals and nursing note reviewed.  Constitutional:      General: She is not in acute distress.    Appearance: Normal appearance. She is not ill-appearing, toxic-appearing or diaphoretic.  HENT:     Head: Normocephalic and atraumatic.     Right Ear: Tympanic membrane, ear canal and external ear normal.     Left Ear: Drainage, swelling and tenderness present. Tympanic membrane is injected, erythematous and bulging.     Mouth/Throat:     Mouth: Mucous membranes are moist.     Pharynx: Oropharynx is clear. No oropharyngeal exudate or posterior oropharyngeal erythema.  Eyes:     Conjunctiva/sclera: Conjunctivae normal.  Cardiovascular:     Rate and Rhythm: Normal rate and regular rhythm.     Pulses: Normal pulses.     Heart sounds: Normal heart sounds.  Pulmonary:     Effort: Pulmonary effort is normal.     Breath sounds: Normal breath sounds.  Abdominal:     General: Abdomen is flat.  Musculoskeletal:        General: Normal range of motion.     Cervical back: Normal range of motion.  Skin:    General: Skin is warm and dry.  Neurological:     General: No focal deficit present.     Mental Status: She is alert and oriented to person, place, and time.  Psychiatric:        Mood and Affect: Mood normal.     UC  Treatments / Results  Labs (all labs ordered are listed, but only abnormal results are displayed) Labs Reviewed - No data to display  EKG   Radiology No results found.  Procedures Procedures (including critical care time)  Medications Ordered in UC Medications - No data to display  Initial  Impression / Assessment and Plan / UC Course  I have reviewed the triage vital signs and the nursing notes.  Pertinent labs & imaging results that were available during my care of the patient were reviewed by me and considered in my medical decision making (see chart for details).    Assessment negative for red flags or concerns.  Cough likely related to post-viral syndrome or long-COVID.  Will treat with tessalon perls and promethazine DM as needed.  Instructed that she should not use Promethazine DM prior to driving or working as it can cause drowsiness.  Discussed conservative symptom management.  Encouraged fluids and rest.  Otitis media of the left ear.  Will treat with Augmentin.  Follow up with primary care as needed.  Final Clinical Impressions(s) / UC Diagnoses   Final diagnoses:  Cough  Non-recurrent acute suppurative otitis media of left ear without spontaneous rupture of tympanic membrane     Discharge Instructions      You can take Tylenol and/or Ibuprofen as needed for pain relief and fever reduction.    You can take the Tessalon perles as needed for cough.  You can take the Promethazine DM as needed for cough at night.  Promethazine DM can make you sleepy so do not take it prior to driving or operating heavy machinery.   You can try honey 1/2 to 1 teaspoon (you can dilute the honey in water or another fluid) to help with cough.     It is important to stay hydrated: drink plenty of fluids (water, gatorade/powerade/pedialyte, juices, or teas) to keep your throat moisturized and help further relieve irritation/discomfort.   Take the Augmentin one pill twice a day for the next  7 days to help with your ear infection.   Return or go to the Emergency Department if symptoms worsen or do not improve in the next few days.      ED Prescriptions     Medication Sig Dispense Auth. Provider   amoxicillin-clavulanate (AUGMENTIN) 875-125 MG tablet Take 1 tablet by mouth every 12 (twelve) hours. 14 tablet Ivette Loyal, NP   benzonatate (TESSALON PERLES) 100 MG capsule Take 2 capsules (200 mg total) by mouth 3 (three) times daily as needed for cough. 20 capsule Ivette Loyal, NP   promethazine-dextromethorphan (PROMETHAZINE-DM) 6.25-15 MG/5ML syrup Take 5 mLs by mouth 4 (four) times daily as needed for cough. 118 mL Ivette Loyal, NP      PDMP not reviewed this encounter.   Ivette Loyal, NP 07/24/21 1122

## 2021-08-03 DIAGNOSIS — Z01419 Encounter for gynecological examination (general) (routine) without abnormal findings: Secondary | ICD-10-CM | POA: Diagnosis not present

## 2021-08-08 DIAGNOSIS — N926 Irregular menstruation, unspecified: Secondary | ICD-10-CM | POA: Diagnosis not present

## 2021-08-10 DIAGNOSIS — M1712 Unilateral primary osteoarthritis, left knee: Secondary | ICD-10-CM | POA: Diagnosis not present

## 2021-08-28 DIAGNOSIS — I10 Essential (primary) hypertension: Secondary | ICD-10-CM | POA: Diagnosis not present

## 2021-08-28 DIAGNOSIS — Z833 Family history of diabetes mellitus: Secondary | ICD-10-CM | POA: Diagnosis not present

## 2021-09-01 ENCOUNTER — Other Ambulatory Visit: Payer: Self-pay | Admitting: Internal Medicine

## 2021-09-01 DIAGNOSIS — Z1231 Encounter for screening mammogram for malignant neoplasm of breast: Secondary | ICD-10-CM

## 2021-10-06 ENCOUNTER — Ambulatory Visit
Admission: RE | Admit: 2021-10-06 | Discharge: 2021-10-06 | Disposition: A | Discharge status: Home / Self Care | Payer: BC Managed Care – PPO | Source: Ambulatory Visit | Attending: Internal Medicine | Admitting: Internal Medicine

## 2021-10-06 ENCOUNTER — Other Ambulatory Visit: Payer: Self-pay

## 2021-10-06 DIAGNOSIS — Z1231 Encounter for screening mammogram for malignant neoplasm of breast: Secondary | ICD-10-CM

## 2021-10-10 ENCOUNTER — Other Ambulatory Visit: Payer: Self-pay | Admitting: Internal Medicine

## 2021-10-10 DIAGNOSIS — R928 Other abnormal and inconclusive findings on diagnostic imaging of breast: Secondary | ICD-10-CM

## 2021-10-30 DIAGNOSIS — I10 Essential (primary) hypertension: Secondary | ICD-10-CM | POA: Diagnosis not present

## 2021-10-30 DIAGNOSIS — E7849 Other hyperlipidemia: Secondary | ICD-10-CM | POA: Diagnosis not present

## 2021-10-30 DIAGNOSIS — M1712 Unilateral primary osteoarthritis, left knee: Secondary | ICD-10-CM | POA: Diagnosis not present

## 2021-10-30 DIAGNOSIS — R7303 Prediabetes: Secondary | ICD-10-CM | POA: Diagnosis not present

## 2021-11-30 ENCOUNTER — Ambulatory Visit
Admission: RE | Admit: 2021-11-30 | Discharge: 2021-11-30 | Disposition: A | Discharge status: Home / Self Care | Payer: BC Managed Care – PPO | Source: Ambulatory Visit | Attending: Internal Medicine | Admitting: Internal Medicine

## 2021-11-30 ENCOUNTER — Other Ambulatory Visit: Payer: Self-pay | Admitting: Internal Medicine

## 2021-11-30 DIAGNOSIS — R922 Inconclusive mammogram: Secondary | ICD-10-CM | POA: Diagnosis not present

## 2021-11-30 DIAGNOSIS — R928 Other abnormal and inconclusive findings on diagnostic imaging of breast: Secondary | ICD-10-CM

## 2022-06-01 ENCOUNTER — Other Ambulatory Visit: Payer: BC Managed Care – PPO

## 2022-07-09 DIAGNOSIS — Z6841 Body Mass Index (BMI) 40.0 and over, adult: Secondary | ICD-10-CM | POA: Diagnosis not present

## 2022-07-09 DIAGNOSIS — R059 Cough, unspecified: Secondary | ICD-10-CM | POA: Diagnosis not present

## 2022-07-09 DIAGNOSIS — R0602 Shortness of breath: Secondary | ICD-10-CM | POA: Diagnosis not present

## 2022-07-09 DIAGNOSIS — R051 Acute cough: Secondary | ICD-10-CM | POA: Diagnosis not present

## 2022-07-09 DIAGNOSIS — U071 COVID-19: Secondary | ICD-10-CM | POA: Diagnosis not present

## 2022-08-06 DIAGNOSIS — I10 Essential (primary) hypertension: Secondary | ICD-10-CM | POA: Diagnosis not present

## 2022-08-06 DIAGNOSIS — Z01419 Encounter for gynecological examination (general) (routine) without abnormal findings: Secondary | ICD-10-CM | POA: Diagnosis not present

## 2022-08-06 DIAGNOSIS — E7849 Other hyperlipidemia: Secondary | ICD-10-CM | POA: Diagnosis not present

## 2022-08-06 DIAGNOSIS — Z0000 Encounter for general adult medical examination without abnormal findings: Secondary | ICD-10-CM | POA: Diagnosis not present

## 2022-08-06 DIAGNOSIS — D259 Leiomyoma of uterus, unspecified: Secondary | ICD-10-CM | POA: Diagnosis not present

## 2022-08-06 DIAGNOSIS — R7303 Prediabetes: Secondary | ICD-10-CM | POA: Diagnosis not present

## 2022-08-06 DIAGNOSIS — N939 Abnormal uterine and vaginal bleeding, unspecified: Secondary | ICD-10-CM | POA: Diagnosis not present

## 2022-08-23 DIAGNOSIS — D259 Leiomyoma of uterus, unspecified: Secondary | ICD-10-CM | POA: Diagnosis not present

## 2022-09-28 ENCOUNTER — Other Ambulatory Visit: Payer: Self-pay | Admitting: Internal Medicine

## 2022-09-28 ENCOUNTER — Ambulatory Visit
Admission: RE | Admit: 2022-09-28 | Discharge: 2022-09-28 | Disposition: A | Discharge status: Home / Self Care | Payer: BC Managed Care – PPO | Source: Ambulatory Visit | Attending: Internal Medicine | Admitting: Internal Medicine

## 2022-09-28 ENCOUNTER — Ambulatory Visit: Admission: RE | Admit: 2022-09-28 | Payer: BC Managed Care – PPO | Source: Ambulatory Visit

## 2022-09-28 DIAGNOSIS — R928 Other abnormal and inconclusive findings on diagnostic imaging of breast: Secondary | ICD-10-CM

## 2022-09-28 DIAGNOSIS — R92323 Mammographic fibroglandular density, bilateral breasts: Secondary | ICD-10-CM | POA: Diagnosis not present

## 2022-12-31 DIAGNOSIS — M170 Bilateral primary osteoarthritis of knee: Secondary | ICD-10-CM | POA: Diagnosis not present

## 2023-02-15 DIAGNOSIS — I10 Essential (primary) hypertension: Secondary | ICD-10-CM | POA: Diagnosis not present

## 2023-02-15 DIAGNOSIS — R7303 Prediabetes: Secondary | ICD-10-CM | POA: Diagnosis not present

## 2023-02-15 DIAGNOSIS — E7849 Other hyperlipidemia: Secondary | ICD-10-CM | POA: Diagnosis not present

## 2023-05-24 IMAGING — MG MM DIGITAL DIAGNOSTIC UNILAT*L* W/ TOMO W/ CAD
6 of 10 series · 6 of 30 positions shown · non-contrast
Comparison: Previous exam(s).

CLINICAL DATA: 60-year-old female recalled from screening mammogram
dated 10/06/2021 for a possible left breast mass and asymmetry.

EXAM:
DIGITAL DIAGNOSTIC UNILATERAL LEFT MAMMOGRAM WITH TOMOSYNTHESIS AND
CAD; ULTRASOUND LEFT BREAST LIMITED
TECHNIQUE: Left digital diagnostic mammography and breast tomosynthesis was
performed. The images were evaluated with computer-aided detection.;
Targeted ultrasound examination of the left breast was performed.

[L MLO synth-2D (1 of 2)]
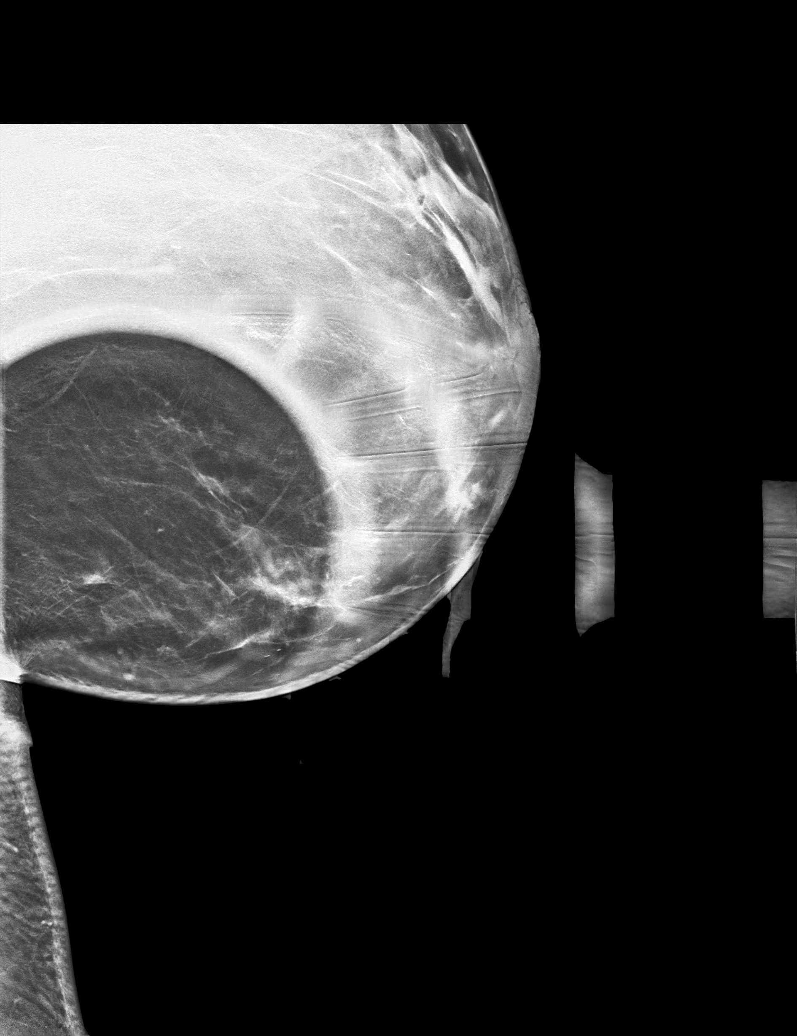

[L MLO synth-2D (2 of 2)]
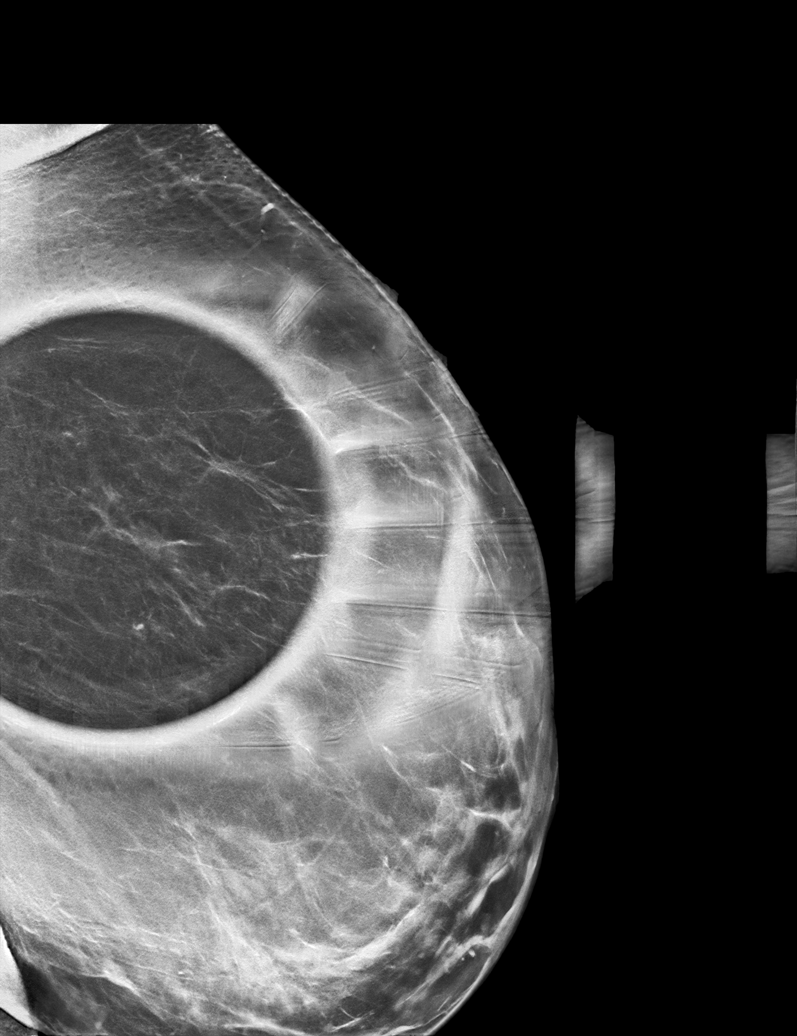

[L CC synth-2D (1 of 3)]
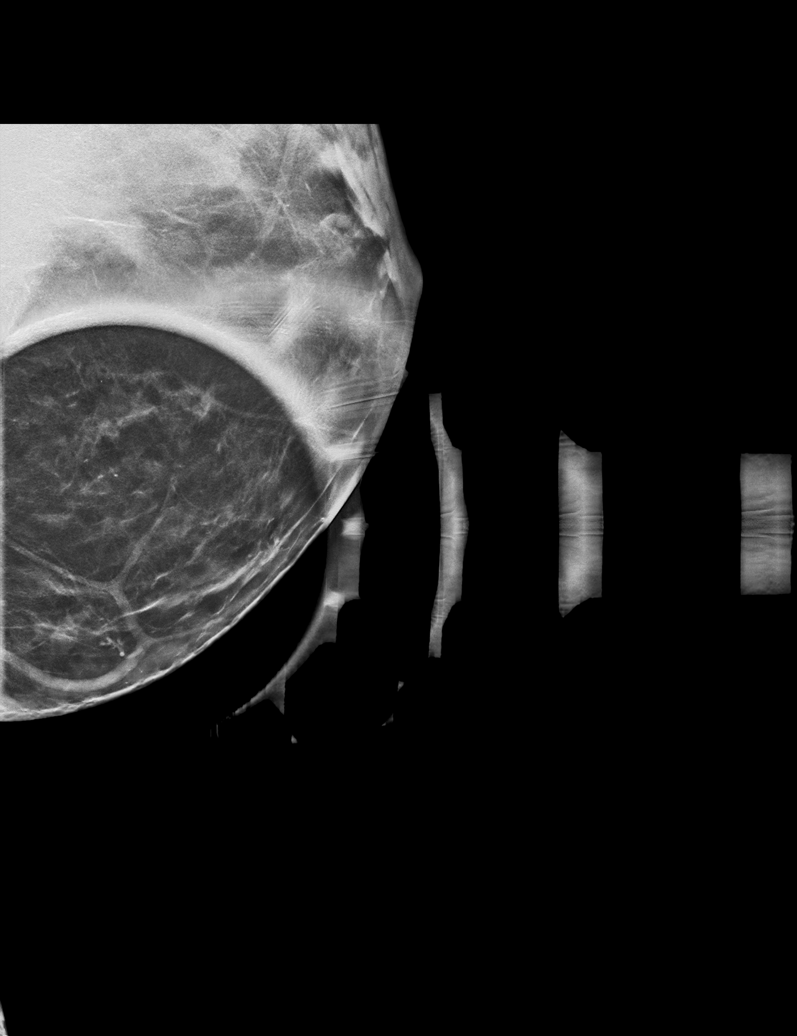

[L CC synth-2D (2 of 3)]
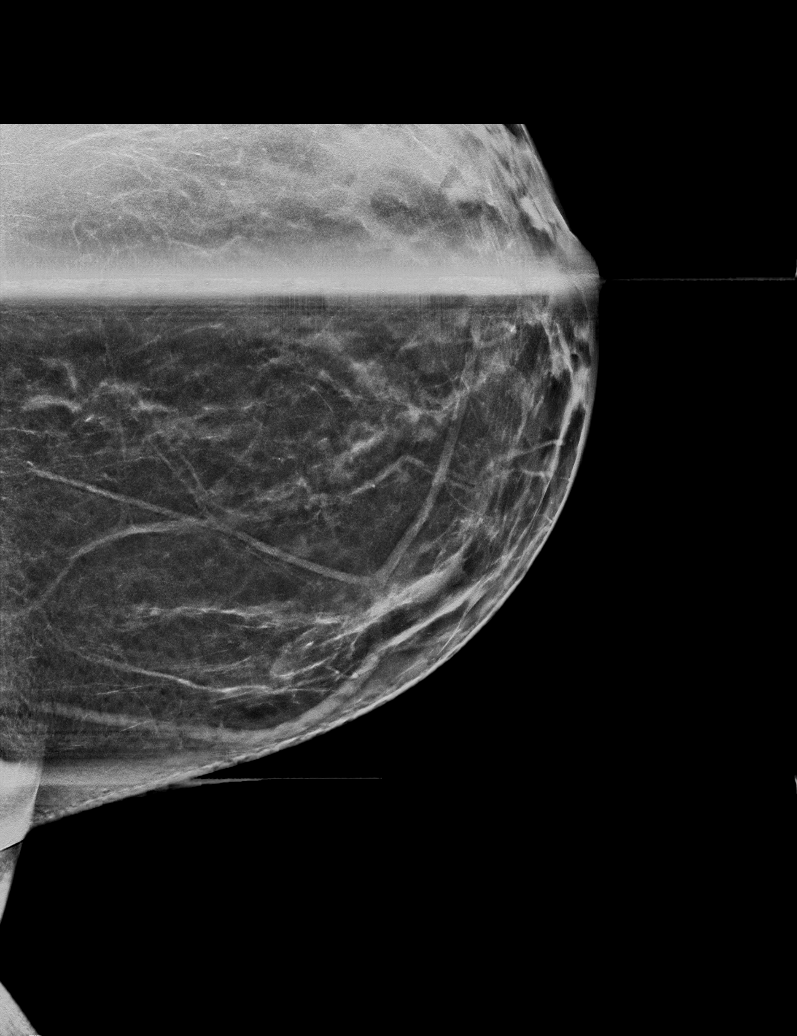

[L CC synth-2D (3 of 3)]
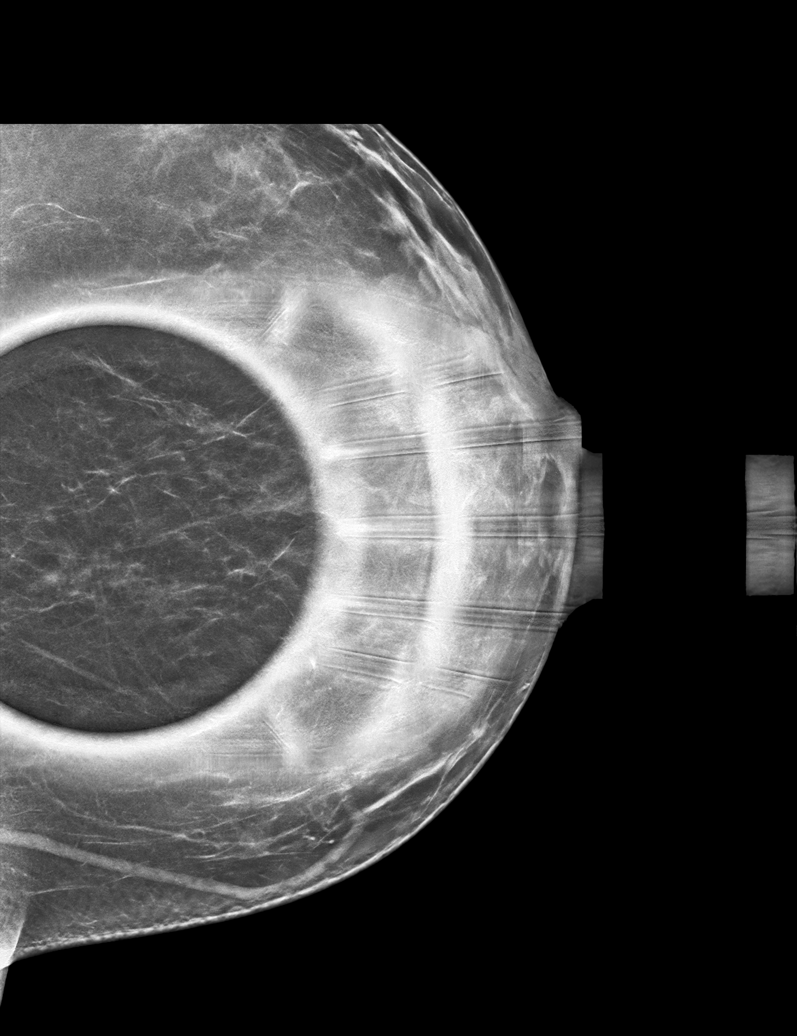

[L CC tomo · tomo slice 31/62.0]
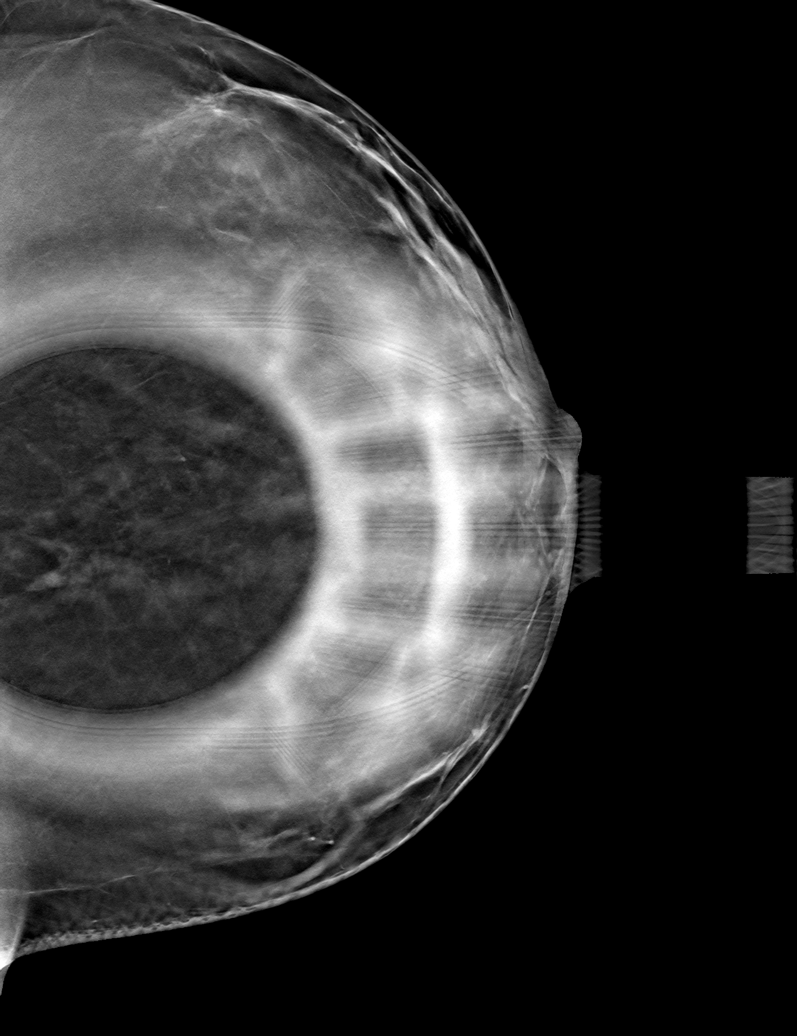

[6 of 30 positions shown; findings below may reference images not displayed]

ACR Breast Density Category b: There are scattered areas of
fibroglandular density.
FINDINGS: Previously described, possible asymmetry in the upper inner left
breast at posterior depth and lower inner left breast at mid depth
both partially efface on today's additional views. Further
evaluation with ultrasound was performed.

Targeted ultrasound is performed, showing no focal or suspicious
sonographic findings in the upper inner and lower inner left breast
which correspond with the mammographic findings.
IMPRESSION: Probably benign left breast focal asymmetries without ultrasound
correlate. Short-term follow-up recommended.

RECOMMENDATION:
Diagnostic left breast mammogram and possible ultrasound in 6
months.

I have discussed the findings and recommendations with the patient.
If applicable, a reminder letter will be sent to the patient
regarding the next appointment.

BI-RADS CATEGORY  3: Probably benign.

## 2023-09-16 DIAGNOSIS — N939 Abnormal uterine and vaginal bleeding, unspecified: Secondary | ICD-10-CM | POA: Diagnosis not present

## 2023-09-16 DIAGNOSIS — D259 Leiomyoma of uterus, unspecified: Secondary | ICD-10-CM | POA: Diagnosis not present

## 2023-09-16 DIAGNOSIS — Z01419 Encounter for gynecological examination (general) (routine) without abnormal findings: Secondary | ICD-10-CM | POA: Diagnosis not present

## 2023-10-02 ENCOUNTER — Other Ambulatory Visit: Payer: Self-pay

## 2023-10-02 DIAGNOSIS — N926 Irregular menstruation, unspecified: Secondary | ICD-10-CM | POA: Diagnosis not present

## 2023-10-02 DIAGNOSIS — Z3202 Encounter for pregnancy test, result negative: Secondary | ICD-10-CM | POA: Diagnosis not present

## 2023-10-02 DIAGNOSIS — D259 Leiomyoma of uterus, unspecified: Secondary | ICD-10-CM | POA: Diagnosis not present

## 2023-10-04 LAB — SURGICAL PATHOLOGY

## 2024-06-26 DIAGNOSIS — I10 Essential (primary) hypertension: Secondary | ICD-10-CM | POA: Diagnosis not present

## 2024-06-26 DIAGNOSIS — R7303 Prediabetes: Secondary | ICD-10-CM | POA: Diagnosis not present

## 2024-06-26 DIAGNOSIS — Z23 Encounter for immunization: Secondary | ICD-10-CM | POA: Diagnosis not present

## 2024-06-26 DIAGNOSIS — Z0000 Encounter for general adult medical examination without abnormal findings: Secondary | ICD-10-CM | POA: Diagnosis not present

## 2024-06-26 DIAGNOSIS — E7849 Other hyperlipidemia: Secondary | ICD-10-CM | POA: Diagnosis not present
